# Patient Record
Sex: Female | Born: 1955 | Race: White | Hispanic: No | Marital: Married | Smoking: Former smoker
Health system: Southern US, Community
[De-identification: ages and names within clinical notes are randomized; demographics above are authoritative.]

## PROBLEM LIST (undated history)

## (undated) DIAGNOSIS — F32A Depression, unspecified: Secondary | ICD-10-CM

## (undated) DIAGNOSIS — G8929 Other chronic pain: Secondary | ICD-10-CM

## (undated) DIAGNOSIS — K219 Gastro-esophageal reflux disease without esophagitis: Secondary | ICD-10-CM

## (undated) DIAGNOSIS — F419 Anxiety disorder, unspecified: Secondary | ICD-10-CM

## (undated) DIAGNOSIS — R002 Palpitations: Secondary | ICD-10-CM

## (undated) DIAGNOSIS — T7840XA Allergy, unspecified, initial encounter: Secondary | ICD-10-CM

## (undated) DIAGNOSIS — N6019 Diffuse cystic mastopathy of unspecified breast: Secondary | ICD-10-CM

## (undated) DIAGNOSIS — M1990 Unspecified osteoarthritis, unspecified site: Secondary | ICD-10-CM

## (undated) DIAGNOSIS — I10 Essential (primary) hypertension: Secondary | ICD-10-CM

## (undated) DIAGNOSIS — B351 Tinea unguium: Secondary | ICD-10-CM

## (undated) DIAGNOSIS — E079 Disorder of thyroid, unspecified: Secondary | ICD-10-CM

## (undated) DIAGNOSIS — J302 Other seasonal allergic rhinitis: Secondary | ICD-10-CM

## (undated) DIAGNOSIS — R1310 Dysphagia, unspecified: Secondary | ICD-10-CM

## (undated) DIAGNOSIS — M5030 Other cervical disc degeneration, unspecified cervical region: Secondary | ICD-10-CM

## (undated) DIAGNOSIS — E039 Hypothyroidism, unspecified: Secondary | ICD-10-CM

## (undated) DIAGNOSIS — R011 Cardiac murmur, unspecified: Secondary | ICD-10-CM

## (undated) DIAGNOSIS — F329 Major depressive disorder, single episode, unspecified: Secondary | ICD-10-CM

## (undated) DIAGNOSIS — G709 Myoneural disorder, unspecified: Secondary | ICD-10-CM

## (undated) DIAGNOSIS — C4490 Unspecified malignant neoplasm of skin, unspecified: Secondary | ICD-10-CM

## (undated) DIAGNOSIS — K760 Fatty (change of) liver, not elsewhere classified: Secondary | ICD-10-CM

## (undated) HISTORY — PX: SPINE SURGERY: SHX786

## (undated) HISTORY — DX: Other seasonal allergic rhinitis: J30.2

## (undated) HISTORY — DX: Other cervical disc degeneration, unspecified cervical region: M50.30

## (undated) HISTORY — DX: Gastro-esophageal reflux disease without esophagitis: K21.9

## (undated) HISTORY — DX: Unspecified malignant neoplasm of skin, unspecified: C44.90

## (undated) HISTORY — DX: Cardiac murmur, unspecified: R01.1

## (undated) HISTORY — DX: Essential (primary) hypertension: I10

## (undated) HISTORY — DX: Tinea unguium: B35.1

## (undated) HISTORY — DX: Depression, unspecified: F32.A

## (undated) HISTORY — DX: Diffuse cystic mastopathy of unspecified breast: N60.19

## (undated) HISTORY — DX: Other chronic pain: G89.29

## (undated) HISTORY — PX: TONSILLECTOMY: SUR1361

## (undated) HISTORY — PX: STRABISMUS SURGERY: SHX218

## (undated) HISTORY — DX: Myoneural disorder, unspecified: G70.9

## (undated) HISTORY — PX: MOHS SURGERY: SUR867

## (undated) HISTORY — DX: Allergy, unspecified, initial encounter: T78.40XA

## (undated) HISTORY — DX: Fatty (change of) liver, not elsewhere classified: K76.0

## (undated) HISTORY — DX: Hypothyroidism, unspecified: E03.9

## (undated) HISTORY — DX: Unspecified osteoarthritis, unspecified site: M19.90

## (undated) HISTORY — DX: Major depressive disorder, single episode, unspecified: F32.9

## (undated) HISTORY — DX: Disorder of thyroid, unspecified: E07.9

## (undated) HISTORY — DX: Anxiety disorder, unspecified: F41.9

## (undated) HISTORY — PX: EYE SURGERY: SHX253

## (undated) HISTORY — DX: Palpitations: R00.2

## (undated) HISTORY — DX: Dysphagia, unspecified: R13.10

---

## 1998-05-10 ENCOUNTER — Encounter: Admission: RE | Admit: 1998-05-10 | Discharge: 1998-08-08 | Payer: Self-pay | Admitting: Internal Medicine

## 1998-07-16 HISTORY — PX: SPINE SURGERY: SHX786

## 1998-10-05 ENCOUNTER — Other Ambulatory Visit: Admission: RE | Admit: 1998-10-05 | Discharge: 1998-10-05 | Payer: Self-pay | Admitting: Obstetrics & Gynecology

## 1999-02-14 ENCOUNTER — Ambulatory Visit (HOSPITAL_COMMUNITY): Admission: RE | Admit: 1999-02-14 | Discharge: 1999-02-14 | Payer: Self-pay | Admitting: Neurosurgery

## 1999-02-14 ENCOUNTER — Encounter: Payer: Self-pay | Admitting: Neurosurgery

## 1999-02-14 HISTORY — PX: CERVICAL DISCECTOMY: SHX98

## 1999-02-27 ENCOUNTER — Encounter: Payer: Self-pay | Admitting: Neurosurgery

## 1999-03-01 ENCOUNTER — Encounter: Payer: Self-pay | Admitting: Neurosurgery

## 1999-03-01 ENCOUNTER — Inpatient Hospital Stay (HOSPITAL_COMMUNITY): Admission: RE | Admit: 1999-03-01 | Discharge: 1999-03-02 | Payer: Self-pay | Admitting: Neurosurgery

## 1999-11-01 ENCOUNTER — Other Ambulatory Visit: Admission: RE | Admit: 1999-11-01 | Discharge: 1999-11-01 | Payer: Self-pay | Admitting: Obstetrics & Gynecology

## 2000-02-07 ENCOUNTER — Encounter: Admission: RE | Admit: 2000-02-07 | Discharge: 2000-02-07 | Payer: Self-pay | Admitting: Obstetrics & Gynecology

## 2000-02-07 ENCOUNTER — Encounter: Payer: Self-pay | Admitting: Obstetrics & Gynecology

## 2000-12-04 ENCOUNTER — Other Ambulatory Visit: Admission: RE | Admit: 2000-12-04 | Discharge: 2000-12-04 | Payer: Self-pay | Admitting: Obstetrics & Gynecology

## 2001-03-04 ENCOUNTER — Encounter: Payer: Self-pay | Admitting: Obstetrics & Gynecology

## 2001-03-04 ENCOUNTER — Encounter: Admission: RE | Admit: 2001-03-04 | Discharge: 2001-03-04 | Payer: Self-pay | Admitting: Obstetrics & Gynecology

## 2001-12-23 ENCOUNTER — Other Ambulatory Visit: Admission: RE | Admit: 2001-12-23 | Discharge: 2001-12-23 | Payer: Self-pay | Admitting: Obstetrics & Gynecology

## 2003-02-22 ENCOUNTER — Other Ambulatory Visit: Admission: RE | Admit: 2003-02-22 | Discharge: 2003-02-22 | Payer: Self-pay | Admitting: Obstetrics & Gynecology

## 2003-07-17 HISTORY — PX: ENDOMETRIAL ABLATION: SHX621

## 2003-09-24 ENCOUNTER — Ambulatory Visit (HOSPITAL_COMMUNITY): Admission: RE | Admit: 2003-09-24 | Discharge: 2003-09-24 | Payer: Self-pay | Admitting: Obstetrics & Gynecology

## 2003-09-24 ENCOUNTER — Encounter (INDEPENDENT_AMBULATORY_CARE_PROVIDER_SITE_OTHER): Payer: Self-pay | Admitting: *Deleted

## 2004-04-05 ENCOUNTER — Other Ambulatory Visit: Admission: RE | Admit: 2004-04-05 | Discharge: 2004-04-05 | Payer: Self-pay | Admitting: Obstetrics & Gynecology

## 2004-07-28 ENCOUNTER — Encounter: Admission: RE | Admit: 2004-07-28 | Discharge: 2004-07-28 | Payer: Self-pay | Admitting: Internal Medicine

## 2005-04-26 ENCOUNTER — Other Ambulatory Visit: Admission: RE | Admit: 2005-04-26 | Discharge: 2005-04-26 | Payer: Self-pay | Admitting: Obstetrics & Gynecology

## 2005-05-03 ENCOUNTER — Emergency Department (HOSPITAL_COMMUNITY): Admission: EM | Admit: 2005-05-03 | Discharge: 2005-05-03 | Payer: Self-pay | Admitting: Family Medicine

## 2006-05-20 ENCOUNTER — Encounter: Admission: RE | Admit: 2006-05-20 | Discharge: 2006-05-20 | Payer: Self-pay | Admitting: Internal Medicine

## 2006-05-20 ENCOUNTER — Emergency Department (HOSPITAL_COMMUNITY): Admission: EM | Admit: 2006-05-20 | Discharge: 2006-05-20 | Payer: Self-pay | Admitting: Family Medicine

## 2006-05-22 ENCOUNTER — Emergency Department (HOSPITAL_COMMUNITY): Admission: EM | Admit: 2006-05-22 | Discharge: 2006-05-22 | Payer: Self-pay | Admitting: Family Medicine

## 2007-07-16 ENCOUNTER — Ambulatory Visit (HOSPITAL_COMMUNITY): Admission: RE | Admit: 2007-07-16 | Discharge: 2007-07-16 | Payer: Self-pay | Admitting: Internal Medicine

## 2007-07-25 ENCOUNTER — Encounter: Admission: RE | Admit: 2007-07-25 | Discharge: 2007-07-25 | Payer: Self-pay | Admitting: Internal Medicine

## 2008-03-31 ENCOUNTER — Encounter: Admission: RE | Admit: 2008-03-31 | Discharge: 2008-03-31 | Payer: Self-pay | Admitting: Internal Medicine

## 2008-06-29 ENCOUNTER — Ambulatory Visit: Payer: Self-pay | Admitting: Internal Medicine

## 2008-09-14 ENCOUNTER — Ambulatory Visit: Payer: Self-pay | Admitting: Internal Medicine

## 2008-09-17 ENCOUNTER — Ambulatory Visit: Payer: Self-pay | Admitting: Internal Medicine

## 2008-09-20 ENCOUNTER — Ambulatory Visit: Payer: Self-pay | Admitting: Internal Medicine

## 2008-12-09 ENCOUNTER — Ambulatory Visit: Payer: Self-pay | Admitting: Internal Medicine

## 2008-12-28 ENCOUNTER — Ambulatory Visit: Payer: Self-pay | Admitting: Internal Medicine

## 2009-06-23 ENCOUNTER — Encounter: Admission: RE | Admit: 2009-06-23 | Discharge: 2009-06-23 | Payer: Self-pay | Admitting: Obstetrics & Gynecology

## 2009-07-04 ENCOUNTER — Ambulatory Visit: Payer: Self-pay | Admitting: Internal Medicine

## 2009-08-02 ENCOUNTER — Ambulatory Visit: Payer: Self-pay | Admitting: Internal Medicine

## 2009-09-19 ENCOUNTER — Ambulatory Visit: Payer: Self-pay | Admitting: Internal Medicine

## 2010-03-23 ENCOUNTER — Ambulatory Visit: Payer: Self-pay | Admitting: Internal Medicine

## 2010-04-28 ENCOUNTER — Encounter: Admission: RE | Admit: 2010-04-28 | Discharge: 2010-04-28 | Payer: Self-pay | Admitting: Neurosurgery

## 2010-09-28 ENCOUNTER — Other Ambulatory Visit: Payer: Self-pay | Admitting: Internal Medicine

## 2010-09-29 ENCOUNTER — Other Ambulatory Visit: Payer: Self-pay | Admitting: Internal Medicine

## 2010-09-29 DIAGNOSIS — E039 Hypothyroidism, unspecified: Secondary | ICD-10-CM

## 2010-09-29 DIAGNOSIS — R5381 Other malaise: Secondary | ICD-10-CM

## 2010-09-29 DIAGNOSIS — R5383 Other fatigue: Secondary | ICD-10-CM

## 2010-11-29 ENCOUNTER — Other Ambulatory Visit (INDEPENDENT_AMBULATORY_CARE_PROVIDER_SITE_OTHER): Payer: 59 | Admitting: Internal Medicine

## 2010-11-29 DIAGNOSIS — N390 Urinary tract infection, site not specified: Secondary | ICD-10-CM

## 2010-11-29 LAB — POCT URINALYSIS DIPSTICK
Bilirubin, UA: NEGATIVE
Blood, UA: NEGATIVE
Protein, UA: NEGATIVE

## 2010-11-29 NOTE — Progress Notes (Signed)
Patient here for u/a after finishing antibiotics.

## 2010-12-01 NOTE — Op Note (Signed)
NAME:  Diana House, Diana House                              ACCOUNT NO.:  [id number]   MEDICAL RECORD NO.:  [id number]                   PATIENT TYPE:  AMB   LOCATION:  [facility]C                                  FACILITY:  [facility]   PHYSICIAN:  Freddy Finner, M.D.                DATE OF BIRTH:  02-07-1956   DATE OF PROCEDURE:  09/24/2003  DATE OF DISCHARGE:                                 OPERATIVE REPORT   PREOPERATIVE DIAGNOSES:  Menorrhagia, intramural leiomyomata.   POSTOPERATIVE DIAGNOSIS:  Menorrhagia, intramural leiomyomata.   PROCEDURE:  Hysteroscopy, dilatation & curettage, NovaSure endometrial  ablation.   SURGEON:  Freddy Finner, M.D.   ANESTHESIA:  IV sedation.   INTRAOPERATIVE COMPLICATIONS:  None.   ESTIMATED INTRAOPERATIVE BLOOD LOSS:  10 cc or less.   INTRAOPERATIVE SORBITOL DEFICIT:  60 cc.   INDICATIONS FOR PROCEDURE:  The patient is House 55 year old white married  female with progressively increasing menorrhagia.  Sonohysterogram in the  office revealed essentially no abnormalities of the endometrial cavity,  although there was an intramural leiomyoma.  The patient is now admitted for  NovaSure ablation for menorrhagia and dysmenorrhea.   DESCRIPTION OF PROCEDURE:  The patient was admitted on the morning of  surgery, brought to the operating room, there placed under adequate IV  sedation, placed in the dorsal lithotomy position using the Allen stirrups  system.  Betadine prep of mons, perineum and vagina was carried out in the  usual fashion.  Sterile drapes were applied.   Bivalve speculum was introduced, cervix was visualized, grasped on the  anterior lip with House single-tooth tenaculum.  Paracervical block was placed  using 10 cc of 1% plain Xylocaine.  Cervix sounded to 4 cm.  Uterus and  cervix sounded to 8 cm.  Cervix was progressively dilated with Pratts to 23.  The ACMI 12.5 degrees hysteroscope was introduced using 3% sorbitol as the  distending medium.  Inspection of  the endometrial cavity revealed no  apparent abnormalities.   The NovaSure device was then placed, tested for uterine perforation.  Standard technique was used for placement and seating of the device.  Measurements associated with this are: The sounding length of 8 cm, cervical  length of 4, cavity length of 4, cavity width of 3.7.  Time of procedure was  81 seconds.  After ablating the cavity, reinspection hysteroscopically was carried out.  Photos were made pre and post NovaSure procedure and are retained in the  office record.  At this point, the patient was awakened and was taken to the  recovery room in good condition.                                               Freddy Finner, M.D.  WRN/MEDQ  D:  09/24/2003  T:  09/24/2003  Job:  [id number]

## 2011-01-19 ENCOUNTER — Other Ambulatory Visit: Payer: Self-pay

## 2011-01-19 MED ORDER — HYDROCODONE-ACETAMINOPHEN 10-650 MG PO TABS: 1.0000 | ORAL_TABLET | Freq: Four times a day (QID) | ORAL | Status: AC | PRN

## 2011-03-15 ENCOUNTER — Other Ambulatory Visit: Payer: Self-pay | Admitting: Internal Medicine

## 2011-03-23 ENCOUNTER — Encounter: Payer: Self-pay | Admitting: Internal Medicine

## 2011-03-27 ENCOUNTER — Other Ambulatory Visit: Payer: Self-pay | Admitting: Internal Medicine

## 2011-03-27 ENCOUNTER — Other Ambulatory Visit: Payer: 59 | Admitting: Internal Medicine

## 2011-03-27 DIAGNOSIS — Z0000 Encounter for general adult medical examination without abnormal findings: Secondary | ICD-10-CM

## 2011-03-27 LAB — COMPREHENSIVE METABOLIC PANEL
AST: 52 U/L — ABNORMAL HIGH (ref 0–37)
Albumin: 4.4 g/dL (ref 3.5–5.2)
Glucose, Bld: 98 mg/dL (ref 70–99)
Potassium: 4.2 mEq/L (ref 3.5–5.3)
Sodium: 141 mEq/L (ref 135–145)
Total Protein: 7 g/dL (ref 6.0–8.3)

## 2011-03-27 LAB — LIPID PANEL
Cholesterol: 184 mg/dL (ref 0–200)
HDL: 61 mg/dL (ref >39)
LDL Cholesterol: 102 mg/dL — ABNORMAL HIGH (ref 0–99)
Total CHOL/HDL Ratio: 3 Ratio
Triglycerides: 107 mg/dL (ref <150)
VLDL: 21 mg/dL (ref 0–40)

## 2011-03-28 LAB — CBC WITH DIFFERENTIAL/PLATELET
Basophils Absolute: 0.1 10*3/uL (ref 0.0–0.1)
Basophils Relative: 1 % (ref 0–1)
Eosinophils Absolute: 0.5 10*3/uL (ref 0.0–0.7)
Eosinophils Relative: 8 % — ABNORMAL HIGH (ref 0–5)
HCT: 40 % (ref 36.0–46.0)
Hemoglobin: 12.7 g/dL (ref 12.0–15.0)
Lymphocytes Relative: 31 % (ref 12–46)
MCH: 33.7 pg (ref 26.0–34.0)
Neutro Abs: 3.2 10*3/uL (ref 1.7–7.7)
RBC: 3.77 MIL/uL — ABNORMAL LOW (ref 3.87–5.11)
WBC: 5.8 10*3/uL (ref 4.0–10.5)

## 2011-03-29 ENCOUNTER — Encounter: Payer: Self-pay | Admitting: Internal Medicine

## 2011-03-29 ENCOUNTER — Ambulatory Visit (INDEPENDENT_AMBULATORY_CARE_PROVIDER_SITE_OTHER): Payer: 59 | Admitting: Internal Medicine

## 2011-03-29 VITALS — BP 122/62 | HR 72 | Temp 97.9°F | Ht 66.0 in | Wt 181.0 lb

## 2011-03-29 DIAGNOSIS — F32A Depression, unspecified: Secondary | ICD-10-CM

## 2011-03-29 DIAGNOSIS — E039 Hypothyroidism, unspecified: Secondary | ICD-10-CM

## 2011-03-29 DIAGNOSIS — M5090 Cervical disc disorder, unspecified, unspecified cervical region: Secondary | ICD-10-CM

## 2011-03-29 DIAGNOSIS — I10 Essential (primary) hypertension: Secondary | ICD-10-CM

## 2011-03-29 DIAGNOSIS — F329 Major depressive disorder, single episode, unspecified: Secondary | ICD-10-CM

## 2011-03-29 DIAGNOSIS — K219 Gastro-esophageal reflux disease without esophagitis: Secondary | ICD-10-CM

## 2011-03-29 DIAGNOSIS — Z0000 Encounter for general adult medical examination without abnormal findings: Secondary | ICD-10-CM

## 2011-03-29 DIAGNOSIS — N6019 Diffuse cystic mastopathy of unspecified breast: Secondary | ICD-10-CM

## 2011-03-29 DIAGNOSIS — E559 Vitamin D deficiency, unspecified: Secondary | ICD-10-CM

## 2011-03-29 DIAGNOSIS — F3289 Other specified depressive episodes: Secondary | ICD-10-CM

## 2011-03-29 DIAGNOSIS — Z85820 Personal history of malignant melanoma of skin: Secondary | ICD-10-CM

## 2011-03-29 LAB — POCT URINALYSIS DIPSTICK
Leukocytes, UA: NEGATIVE
Protein, UA: NEGATIVE
Spec Grav, UA: 1
Urobilinogen, UA: NEGATIVE

## 2011-04-11 ENCOUNTER — Encounter: Payer: Self-pay | Admitting: Internal Medicine

## 2011-04-11 DIAGNOSIS — E039 Hypothyroidism, unspecified: Secondary | ICD-10-CM | POA: Insufficient documentation

## 2011-04-11 DIAGNOSIS — M5090 Cervical disc disorder, unspecified, unspecified cervical region: Secondary | ICD-10-CM | POA: Insufficient documentation

## 2011-04-11 DIAGNOSIS — F32A Depression, unspecified: Secondary | ICD-10-CM | POA: Insufficient documentation

## 2011-04-11 DIAGNOSIS — I10 Essential (primary) hypertension: Secondary | ICD-10-CM | POA: Insufficient documentation

## 2011-04-11 DIAGNOSIS — F329 Major depressive disorder, single episode, unspecified: Secondary | ICD-10-CM | POA: Insufficient documentation

## 2011-04-11 DIAGNOSIS — E559 Vitamin D deficiency, unspecified: Secondary | ICD-10-CM | POA: Insufficient documentation

## 2011-04-11 DIAGNOSIS — Z85820 Personal history of malignant melanoma of skin: Secondary | ICD-10-CM | POA: Insufficient documentation

## 2011-04-11 DIAGNOSIS — K219 Gastro-esophageal reflux disease without esophagitis: Secondary | ICD-10-CM | POA: Insufficient documentation

## 2011-04-11 DIAGNOSIS — N6019 Diffuse cystic mastopathy of unspecified breast: Secondary | ICD-10-CM | POA: Insufficient documentation

## 2011-04-11 NOTE — Progress Notes (Signed)
  Subjective:    Patient ID: Diana House, female    DOB: 08-15-1955, 55 y.o.   MRN: [id number]  HPI 55 year old married white female in today for evaluation of medical problems including depression, hypothyroidism, GE reflux, history of vitamin D deficiency, hypertension, fibrocystic breast disease, history of melanoma mid back December 2005, cervical disc disease C5-C6 and C6-C7 requiring surgery August 2000.  Is intolerant of Biaxin. Had a brachial plexus injury 1998. Surgery for strabismus at age 70 and again at age 71, tonsillectomy and HTN, endometrial ablation by Dr. Jennette Kettle 2005   Review of Systems  Constitutional: Positive for fatigue.  HENT: Positive for neck stiffness.   Eyes: Negative.   Respiratory: Negative.   Cardiovascular: Negative.   Gastrointestinal: Negative.   Genitourinary: Negative.   Skin: Negative.   Neurological: Negative.   Hematological: Negative.   Psychiatric/Behavioral: Positive for dysphoric mood. Negative for suicidal ideas and self-injury.       Objective:   Physical Exam  Vitals reviewed. Constitutional: She is oriented to person, place, and time. No distress.  HENT:  Head: Normocephalic and atraumatic.  Right Ear: External ear normal.  Left Ear: External ear normal.  Mouth/Throat: Oropharynx is clear and moist.  Eyes: Pupils are equal, round, and reactive to light. Right eye exhibits no discharge. Left eye exhibits no discharge. No scleral icterus.  Neck: Normal range of motion. Neck supple. No JVD present. No thyromegaly present.  Cardiovascular: Normal rate, regular rhythm and normal heart sounds.   No murmur heard. Pulmonary/Chest: Effort normal and breath sounds normal. She has no wheezes. She has no rales.  Abdominal: Soft. Bowel sounds are normal. She exhibits no distension and no mass. There is no rebound and no guarding.  Genitourinary:       Deferred to GYN  Musculoskeletal: Normal range of motion. She exhibits no edema.  Neurological:  She is alert and oriented to person, place, and time. She has normal reflexes. No cranial nerve deficit. Coordination normal.  Skin: Skin is warm and dry. She is not diaphoretic.       No abnormal nevi  Psychiatric:       Depressed affect          Assessment & Plan:   Depression  Hypothyroidism on thyroid replacement therapy  GE reflux  History of vitamin D deficiency  History of melanoma mid back  History of hypertension  Fibrocystic breast disease  History of cervical disc disease requires Lorcet for pain control  Review lab work with her. Continue same medications. Confides that daughter has recently told her she may be gay and this is devastating to patient. Recommend psychologist for patient to discuss this with. Return in 6 months at which time she'll be due for a TSH office visit and evaluation.

## 2011-04-12 ENCOUNTER — Other Ambulatory Visit: Payer: Self-pay

## 2011-04-12 MED ORDER — HYDROCODONE-ACETAMINOPHEN 10-650 MG PO TABS: 1.0000 | ORAL_TABLET | Freq: Four times a day (QID) | ORAL | Status: AC | PRN

## 2011-04-12 MED ORDER — HYDROCODONE-ACETAMINOPHEN 10-650 MG PO TABS: 1.0000 | ORAL_TABLET | Freq: Four times a day (QID) | ORAL | Status: DC | PRN

## 2011-04-20 LAB — COMPREHENSIVE METABOLIC PANEL
ALT: 21
Calcium: 9.2
Creatinine, Ser: 0.61
GFR calc Af Amer: >60
GFR calc non Af Amer: >60
Potassium: 4.6
Sodium: 139

## 2011-04-20 LAB — LIPID PANEL
Cholesterol: 178
Total CHOL/HDL Ratio: 2.7
Triglycerides: 96

## 2011-04-20 LAB — DIFFERENTIAL
Eosinophils Relative: 8 — ABNORMAL HIGH
Monocytes Relative: 6
Neutro Abs: 3.5
Neutrophils Relative %: 53

## 2011-04-20 LAB — CBC
MCHC: 34.6
RBC: 3.8 — ABNORMAL LOW
RDW: 12.5

## 2011-06-15 ENCOUNTER — Ambulatory Visit (INDEPENDENT_AMBULATORY_CARE_PROVIDER_SITE_OTHER): Payer: 59 | Admitting: Internal Medicine

## 2011-06-15 ENCOUNTER — Encounter: Payer: Self-pay | Admitting: Internal Medicine

## 2011-06-15 VITALS — BP 136/90 | HR 80 | Temp 98.6°F | Wt 179.0 lb

## 2011-06-15 DIAGNOSIS — R300 Dysuria: Secondary | ICD-10-CM

## 2011-06-15 DIAGNOSIS — N390 Urinary tract infection, site not specified: Secondary | ICD-10-CM

## 2011-06-15 LAB — POCT URINALYSIS DIPSTICK
Bilirubin, UA: NEGATIVE
Blood, UA: NEGATIVE
Glucose, UA: NEGATIVE
Spec Grav, UA: 1.01
pH, UA: 7.5

## 2011-06-15 NOTE — Patient Instructions (Signed)
Take doxycycline twice daily for 10 days. Call if symptoms recur or sooner if worse.

## 2011-06-15 NOTE — Progress Notes (Signed)
  Subjective:    Patient ID: Diana House, female    DOB: 19-Feb-1956, 55 y.o.   MRN: [id number]  HPI 55 year old white married female with complaint of dysuria. . No fever, chills, nausea or back pain.    Review of Systems     Objective:   Physical Exam no CVA tenderness; urinalysis is abnormal. See report.        Assessment & Plan:  UTI  Plan: Doxycycline 100 mg by mouth twice daily for 10 days

## 2011-06-26 ENCOUNTER — Encounter: Payer: Self-pay | Admitting: Internal Medicine

## 2011-06-26 ENCOUNTER — Ambulatory Visit (INDEPENDENT_AMBULATORY_CARE_PROVIDER_SITE_OTHER): Payer: 59 | Admitting: Internal Medicine

## 2011-06-26 ENCOUNTER — Other Ambulatory Visit: Payer: Self-pay | Admitting: Internal Medicine

## 2011-06-26 VITALS — BP 146/88 | HR 80 | Temp 98.5°F | Wt 179.0 lb

## 2011-06-26 DIAGNOSIS — R6889 Other general symptoms and signs: Secondary | ICD-10-CM

## 2011-06-26 DIAGNOSIS — R300 Dysuria: Secondary | ICD-10-CM

## 2011-06-26 DIAGNOSIS — R7989 Other specified abnormal findings of blood chemistry: Secondary | ICD-10-CM

## 2011-06-26 DIAGNOSIS — E039 Hypothyroidism, unspecified: Secondary | ICD-10-CM

## 2011-06-26 DIAGNOSIS — R945 Abnormal results of liver function studies: Secondary | ICD-10-CM

## 2011-06-26 DIAGNOSIS — D7589 Other specified diseases of blood and blood-forming organs: Secondary | ICD-10-CM

## 2011-06-26 LAB — CBC WITH DIFFERENTIAL/PLATELET
Eosinophils Absolute: 0.3 10*3/uL (ref 0.0–0.7)
Lymphocytes Relative: 35 % (ref 12–46)
MCH: 33.9 pg (ref 26.0–34.0)
MCV: 102.1 fL — ABNORMAL HIGH (ref 78.0–100.0)
Monocytes Absolute: 0.6 10*3/uL (ref 0.1–1.0)
Monocytes Relative: 7 % (ref 3–12)
Neutro Abs: 4.3 10*3/uL (ref 1.7–7.7)
WBC: 8 10*3/uL (ref 4.0–10.5)

## 2011-06-26 LAB — POCT URINALYSIS DIPSTICK
Bilirubin, UA: NEGATIVE
Blood, UA: NEGATIVE
Ketones, UA: NEGATIVE
Spec Grav, UA: 1.015

## 2011-06-26 LAB — VITAMIN B12: Vitamin B-12: 761 pg/mL (ref 211–911)

## 2011-06-26 LAB — HEPATIC FUNCTION PANEL: AST: 31 U/L (ref 0–37)

## 2011-06-26 NOTE — Progress Notes (Signed)
  Subjective:    Patient ID: Diana House, female    DOB: 1956/07/11, 55 y.o.   MRN: [id number]  HPI Patient returns today complaining of dysuria at end of stream that never went away after being on doxycycline. Urine recollected today. Urinalysis is normal. Culture was sent to lab today. Also, following up on macrocytosis which was 106.1 in September with normal B12 and folate levels. Also following up on mild liver function elevations in September. She does take Lorcet 10/650 on a  regular basis for neck pain. This could possibly cause mild elevation in liver functions.    Review of Systems     Objective:   Physical Exam no CVA tenderness        Assessment & Plan:  Dysuria/urethritis  Elevated liver functions-recheck liver panel  Macrocytosis-recheck CBC and B12 level  Hypothyroidism  Plan: Patient due for repeat TSH March 2013. TSH checked September 2012 was entirely within normal limits on Synthroid. At her request refill Wellbutrin and Lexapro #90 each with when necessary one year refills to mail to [facility].  For dysuria Cipro 500 mg twice daily for 7 days. If symptoms do not resolve, refer to urologist

## 2011-06-26 NOTE — Patient Instructions (Signed)
Take Cipro twice daily for 7 days. If symptoms persist, we will refer you to urologist. Continue Synthroid as previously prescribed. We have checked liver functions, CBC and B12 level today. We have refilled Wellbutrin and Lexapro for one year.

## 2011-06-28 ENCOUNTER — Ambulatory Visit: Payer: 59 | Admitting: Internal Medicine

## 2011-06-28 LAB — URINE CULTURE

## 2011-07-02 ENCOUNTER — Other Ambulatory Visit: Payer: Self-pay | Admitting: Internal Medicine

## 2011-07-06 ENCOUNTER — Other Ambulatory Visit: Payer: Self-pay

## 2011-07-06 MED ORDER — HYDROCODONE-APAP-DIETARY PROD 10-650 MG PO MISC: 1.0000 | Freq: Four times a day (QID) | ORAL | Status: DC | PRN

## 2011-08-03 ENCOUNTER — Other Ambulatory Visit: Payer: Self-pay | Admitting: Internal Medicine

## 2011-08-06 ENCOUNTER — Other Ambulatory Visit: Payer: Self-pay

## 2011-08-06 MED ORDER — HYDROCODONE-APAP-DIETARY PROD 10-650 MG PO MISC: 1.0000 | Freq: Four times a day (QID) | ORAL | Status: DC | PRN

## 2011-10-01 ENCOUNTER — Other Ambulatory Visit: Payer: Self-pay | Admitting: Internal Medicine

## 2011-11-08 ENCOUNTER — Other Ambulatory Visit: Payer: 59 | Admitting: Internal Medicine

## 2011-11-08 DIAGNOSIS — E039 Hypothyroidism, unspecified: Secondary | ICD-10-CM

## 2011-11-08 LAB — TSH: TSH: 0.873 u[IU]/mL (ref 0.350–4.500)

## 2011-11-09 ENCOUNTER — Ambulatory Visit (INDEPENDENT_AMBULATORY_CARE_PROVIDER_SITE_OTHER): Payer: 59 | Admitting: Internal Medicine

## 2011-11-09 VITALS — BP 130/80 | Wt 174.0 lb

## 2011-11-09 DIAGNOSIS — F32A Depression, unspecified: Secondary | ICD-10-CM

## 2011-11-09 DIAGNOSIS — Z85820 Personal history of malignant melanoma of skin: Secondary | ICD-10-CM

## 2011-11-09 DIAGNOSIS — F329 Major depressive disorder, single episode, unspecified: Secondary | ICD-10-CM

## 2011-11-09 DIAGNOSIS — E039 Hypothyroidism, unspecified: Secondary | ICD-10-CM

## 2011-11-09 DIAGNOSIS — I10 Essential (primary) hypertension: Secondary | ICD-10-CM

## 2011-11-09 DIAGNOSIS — M5090 Cervical disc disorder, unspecified, unspecified cervical region: Secondary | ICD-10-CM

## 2011-11-14 ENCOUNTER — Encounter: Payer: Self-pay | Admitting: Internal Medicine

## 2011-11-14 NOTE — Progress Notes (Signed)
  Subjective:    Patient ID: Tyson Dense, female    DOB: Jan 21, 1956, 56 y.o.   MRN: [id number]  HPI 56 year old white female with history of hypertension, cervical disc disease, depression, GE reflux, history of melanoma, hypothyroidism and vitamin D deficiency for six-month recheck. Takes Wellbutrin, HCTZ, Lexapro, hydrocodone/APAP 10/650 for neck pain, Prilosec and Synthroid 0.15 mg daily. TSH drawn today. Doing well without significant complaints. 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 No increase in neck pain with new job   Review of Systems     Objective:   Physical Exam neck is supple without thyromegaly; chest clear to auscultation, cardiac exam: regular rate and rhythm, extremities without edema        Assessment & Plan:  Hypothyroidism  Depression  Cervical disc disease  Hypertension  GE reflux  Plan: Okay to refill Lorcet 10/650 if pharmacy calls. May have #60 every 30 days only. Return in 6 months for physical exam.  TSH is normal. Continue same dose of Synthroid.

## 2011-11-14 NOTE — Patient Instructions (Signed)
Continue same medications. Continue same dose of Synthroid. Return in 6 months for physical exam. Take Lorcet 10/650 sparingly no more than twice daily for neck pain.

## 2011-12-29 ENCOUNTER — Other Ambulatory Visit: Payer: Self-pay | Admitting: Internal Medicine

## 2012-01-21 ENCOUNTER — Other Ambulatory Visit: Payer: Self-pay

## 2012-01-22 ENCOUNTER — Other Ambulatory Visit: Payer: Self-pay

## 2012-01-22 MED ORDER — HYDROCODONE-APAP-DIETARY PROD 10-650 MG PO MISC: 1.0000 | Freq: Four times a day (QID) | ORAL | Status: DC | PRN

## 2012-03-31 ENCOUNTER — Other Ambulatory Visit: Payer: Self-pay | Admitting: Internal Medicine

## 2012-03-31 ENCOUNTER — Other Ambulatory Visit: Payer: 59 | Admitting: Internal Medicine

## 2012-03-31 DIAGNOSIS — I10 Essential (primary) hypertension: Secondary | ICD-10-CM

## 2012-03-31 DIAGNOSIS — E039 Hypothyroidism, unspecified: Secondary | ICD-10-CM

## 2012-03-31 DIAGNOSIS — E559 Vitamin D deficiency, unspecified: Secondary | ICD-10-CM

## 2012-04-01 ENCOUNTER — Encounter: Payer: Self-pay | Admitting: Internal Medicine

## 2012-04-01 ENCOUNTER — Ambulatory Visit (INDEPENDENT_AMBULATORY_CARE_PROVIDER_SITE_OTHER): Payer: 59 | Admitting: Internal Medicine

## 2012-04-01 VITALS — BP 142/86 | HR 76 | Ht 65.75 in | Wt 174.0 lb

## 2012-04-01 DIAGNOSIS — F329 Major depressive disorder, single episode, unspecified: Secondary | ICD-10-CM

## 2012-04-01 DIAGNOSIS — E039 Hypothyroidism, unspecified: Secondary | ICD-10-CM

## 2012-04-01 DIAGNOSIS — M542 Cervicalgia: Secondary | ICD-10-CM

## 2012-04-01 DIAGNOSIS — Z8639 Personal history of other endocrine, nutritional and metabolic disease: Secondary | ICD-10-CM

## 2012-04-01 DIAGNOSIS — Z85820 Personal history of malignant melanoma of skin: Secondary | ICD-10-CM

## 2012-04-01 DIAGNOSIS — N6019 Diffuse cystic mastopathy of unspecified breast: Secondary | ICD-10-CM

## 2012-04-01 DIAGNOSIS — G8929 Other chronic pain: Secondary | ICD-10-CM

## 2012-04-01 DIAGNOSIS — I10 Essential (primary) hypertension: Secondary | ICD-10-CM

## 2012-04-01 DIAGNOSIS — K219 Gastro-esophageal reflux disease without esophagitis: Secondary | ICD-10-CM

## 2012-04-01 DIAGNOSIS — M5090 Cervical disc disorder, unspecified, unspecified cervical region: Secondary | ICD-10-CM

## 2012-04-01 DIAGNOSIS — F32A Depression, unspecified: Secondary | ICD-10-CM

## 2012-04-01 LAB — CBC WITH DIFFERENTIAL/PLATELET
Basophils Absolute: 0.1 10*3/uL (ref 0.0–0.1)
Basophils Relative: 1 % (ref 0–1)
Eosinophils Absolute: 0.4 10*3/uL (ref 0.0–0.7)
Hemoglobin: 12.6 g/dL (ref 12.0–15.0)
MCH: 33.6 pg (ref 26.0–34.0)
MCHC: 32 g/dL (ref 30.0–36.0)
Monocytes Relative: 6 % (ref 3–12)
Neutro Abs: 4.6 10*3/uL (ref 1.7–7.7)
Neutrophils Relative %: 58 % (ref 43–77)
Platelets: 270 10*3/uL (ref 150–400)
RBC: 3.75 MIL/uL — ABNORMAL LOW (ref 3.87–5.11)

## 2012-04-01 LAB — COMPREHENSIVE METABOLIC PANEL
ALT: 48 U/L — ABNORMAL HIGH (ref 0–35)
AST: 33 U/L (ref 0–37)
Albumin: 4.6 g/dL (ref 3.5–5.2)
Alkaline Phosphatase: 89 U/L (ref 39–117)
Glucose, Bld: 92 mg/dL (ref 70–99)
Potassium: 4.1 mEq/L (ref 3.5–5.3)
Sodium: 139 mEq/L (ref 135–145)
Total Bilirubin: 0.5 mg/dL (ref 0.3–1.2)
Total Protein: 6.8 g/dL (ref 6.0–8.3)

## 2012-04-01 LAB — TSH: TSH: 1.466 u[IU]/mL (ref 0.350–4.500)

## 2012-04-01 LAB — VITAMIN D 25 HYDROXY (VIT D DEFICIENCY, FRACTURES): Vit D, 25-Hydroxy: 38 ng/mL (ref 30–89)

## 2012-04-01 LAB — POCT URINALYSIS DIPSTICK
Ketones, UA: NEGATIVE
Leukocytes, UA: NEGATIVE
Protein, UA: NEGATIVE
Urobilinogen, UA: NEGATIVE
pH, UA: 6

## 2012-04-01 LAB — LIPID PANEL
LDL Cholesterol: 109 mg/dL — ABNORMAL HIGH (ref 0–99)
Triglycerides: 119 mg/dL (ref <150)
VLDL: 24 mg/dL (ref 0–40)

## 2012-04-01 LAB — FOLATE: Folate: 4.4 ng/mL

## 2012-04-10 ENCOUNTER — Other Ambulatory Visit: Payer: Self-pay | Admitting: Internal Medicine

## 2012-04-10 NOTE — Telephone Encounter (Signed)
Give # 60 Lorcet with 2 refills please

## 2012-04-11 ENCOUNTER — Other Ambulatory Visit: Payer: Self-pay

## 2012-06-22 ENCOUNTER — Encounter: Payer: Self-pay | Admitting: Internal Medicine

## 2012-06-22 NOTE — Progress Notes (Signed)
  Subjective:    Patient ID: Diana House, female    DOB: [birth year], 56 y.o.   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 Recent lab work showed macrocytosis. B12 and folate levels will be added to lab work. GYN has her on Vivelle dot for estrogen replacement and progesterone.  Had brachial plexus injury 1998, melanoma removed from mid back December 2005 with no recurrence. 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 Jennette Kettle in 2005.  Tetanus immunization obtained through employment as his flu vaccine.  Intolerant of Biaxin otherwise no known drug allergies.  She is married and has 2 sons and a daughter.  Occasional wine consumption. Does not smoke.  Family history: Father with history coronary artery disease, diabetes, CABG and hypertension 3 brothers and one sister in good health. Mother in good health.  History of lump left breast in 1995 which resolved spontaneously. [facility] and [facility] Dr. Orpah Greek for followup.    Review of Systems  Constitutional: Positive for fatigue.  HENT: Negative.   Eyes: Negative.   Respiratory: Negative.   Cardiovascular: Negative.   Gastrointestinal: Negative.   Genitourinary: Negative.   Musculoskeletal: Positive for arthralgias.       Neck pain  Neurological: Negative.   Hematological: Negative.   Psychiatric/Behavioral: Positive for dysphoric mood.       Objective:   Physical Exam  Vitals reviewed. Constitutional: She is oriented to person, place, and time. She appears well-developed and well-nourished. No distress.  HENT:   Head: Normocephalic and atraumatic.  Right Ear: External ear normal.  Left Ear: External ear normal.  Mouth/Throat: Oropharynx is clear and moist.  Eyes: Conjunctivae normal are normal. Pupils are equal, round, and reactive to light. Right eye exhibits no discharge. Left eye exhibits no discharge.  Neck: Neck supple. No JVD present. No thyromegaly present.  Cardiovascular: Normal rate, regular rhythm, normal heart sounds and intact distal pulses.   No murmur heard. Pulmonary/Chest: Effort normal and breath sounds normal. No respiratory distress. She has no wheezes. She has no rales.       Breasts normal female without masses  Abdominal: Soft. Bowel sounds are normal. She exhibits no distension and no mass. There is no tenderness. There is no rebound and no guarding.  Genitourinary:       Deferred to Dr. Jennette Kettle  Musculoskeletal: She exhibits no edema.  Lymphadenopathy:    She has no cervical adenopathy.  Neurological: She is oriented to person, place, and time. She has normal reflexes. She displays normal reflexes. No cranial nerve deficit. Coordination abnormal.  Skin: Skin is warm and dry. No rash noted. She is not diaphoretic.  Psychiatric: She has a normal mood and affect. Her behavior is normal. Judgment normal.          Assessment & Plan:  Hypothyroidism  History of melanoma  History of depression  GE reflux  Hypertension  Vitamin D deficiency  Fibrocystic breast disease  Plan: Return in 6 months for followup TSH and evaluation of medical issues. Continue to take hydrocodone/APAP sparingly for neck and back pain.

## 2012-06-22 NOTE — Patient Instructions (Addendum)
Continue same medications and return in 6 months 

## 2012-06-23 ENCOUNTER — Other Ambulatory Visit: Payer: Self-pay | Admitting: Internal Medicine

## 2012-06-24 ENCOUNTER — Other Ambulatory Visit: Payer: Self-pay

## 2012-06-24 MED ORDER — HYDROCODONE-APAP-DIETARY PROD 10-325 MG PO MISC: 10.0000 mg | Freq: Four times a day (QID) | ORAL | Status: DC | PRN

## 2012-06-24 NOTE — Telephone Encounter (Signed)
Please change to Hydrocodone 10/325 and refill for 6 months . Sixty should last q 30 days.

## 2012-08-30 ENCOUNTER — Other Ambulatory Visit: Payer: Self-pay

## 2012-09-29 ENCOUNTER — Other Ambulatory Visit: Payer: 59 | Admitting: Internal Medicine

## 2012-09-29 DIAGNOSIS — E785 Hyperlipidemia, unspecified: Secondary | ICD-10-CM

## 2012-09-29 DIAGNOSIS — E039 Hypothyroidism, unspecified: Secondary | ICD-10-CM

## 2012-09-29 DIAGNOSIS — D649 Anemia, unspecified: Secondary | ICD-10-CM

## 2012-09-29 LAB — LIPID PANEL
LDL Cholesterol: 84 mg/dL (ref 0–99)
VLDL: 30 mg/dL (ref 0–40)

## 2012-09-29 LAB — CBC WITH DIFFERENTIAL/PLATELET
Basophils Absolute: 0.1 10*3/uL (ref 0.0–0.1)
Basophils Relative: 1 % (ref 0–1)
Eosinophils Absolute: 0.4 10*3/uL (ref 0.0–0.7)
Eosinophils Relative: 6 % — ABNORMAL HIGH (ref 0–5)
HCT: 37.2 % (ref 36.0–46.0)
MCHC: 34.4 g/dL (ref 30.0–36.0)
MCV: 96.4 fL (ref 78.0–100.0)
Monocytes Absolute: 0.4 10*3/uL (ref 0.1–1.0)
RDW: 12.9 % (ref 11.5–15.5)

## 2012-09-30 ENCOUNTER — Encounter: Payer: Self-pay | Admitting: Internal Medicine

## 2012-09-30 ENCOUNTER — Ambulatory Visit (INDEPENDENT_AMBULATORY_CARE_PROVIDER_SITE_OTHER): Payer: 59 | Admitting: Internal Medicine

## 2012-09-30 VITALS — BP 130/80 | Temp 97.7°F | Wt 180.0 lb

## 2012-09-30 DIAGNOSIS — F329 Major depressive disorder, single episode, unspecified: Secondary | ICD-10-CM

## 2012-09-30 DIAGNOSIS — K219 Gastro-esophageal reflux disease without esophagitis: Secondary | ICD-10-CM

## 2012-09-30 DIAGNOSIS — F32A Depression, unspecified: Secondary | ICD-10-CM

## 2012-09-30 DIAGNOSIS — I10 Essential (primary) hypertension: Secondary | ICD-10-CM

## 2012-09-30 DIAGNOSIS — M545 Low back pain, unspecified: Secondary | ICD-10-CM

## 2012-09-30 DIAGNOSIS — E039 Hypothyroidism, unspecified: Secondary | ICD-10-CM

## 2012-09-30 DIAGNOSIS — Z8639 Personal history of other endocrine, nutritional and metabolic disease: Secondary | ICD-10-CM

## 2012-09-30 DIAGNOSIS — M5090 Cervical disc disorder, unspecified, unspecified cervical region: Secondary | ICD-10-CM

## 2012-09-30 DIAGNOSIS — Z85820 Personal history of malignant melanoma of skin: Secondary | ICD-10-CM

## 2012-09-30 MED ORDER — LEVOTHYROXINE SODIUM 150 MCG PO TABS: 150.0000 ug | ORAL_TABLET | Freq: Every day | ORAL | Status: DC

## 2012-09-30 MED ORDER — ESCITALOPRAM OXALATE 10 MG PO TABS: 10.0000 mg | ORAL_TABLET | Freq: Every day | ORAL | Status: DC

## 2012-09-30 MED ORDER — HYDROCHLOROTHIAZIDE 25 MG PO TABS: 25.0000 mg | ORAL_TABLET | Freq: Every day | ORAL | Status: DC

## 2012-09-30 MED ORDER — BUPROPION HCL ER (XL) 300 MG PO TB24: 300.0000 mg | ORAL_TABLET | Freq: Every day | ORAL | Status: DC

## 2012-09-30 NOTE — Progress Notes (Signed)
  Subjective:    Patient ID: Diana House, female    DOB: [birth year], 57 y.o.   MRN: [id number]  HPI In today for six-month recheck on hypertension and hypothyroidism. She has returned to [facility] ER and feels better mentally than she has in  a long time. She is working [employer] there and  is quite happy. History of depression, vitamin D deficiency, fibrocystic breast disease, GE reflux, cervical disc disease, low back pain, history of melanoma. Has been seen by dermatologist recently for skin check. Has had mammogram and GYN exam recently as well. Influenza immunization done through employment. Continues on Wellbutrin and generic Lexapro. Taking HCTZ for hypertension. 4 chronic musculoskeletal pain neck and back she is taking hydrocodone/APAP 10/325 twice daily. Sometimes needs an extra half tablet for  pain control. Offered to send her to physical therapy for back pain. She will consider it. GYN has her taking Krill oil. GE reflux controlled with omeprazole.    Review of Systems     Objective:   Physical Exam  Vitals reviewed. Neck: No thyromegaly present.  Cardiovascular: Normal rate, regular rhythm and normal heart sounds.   Pulmonary/Chest: Effort normal and breath sounds normal. No respiratory distress. She has no wheezes. She has no rales.  Lymphadenopathy:    She has no cervical adenopathy.  Skin: Skin is warm and dry.  Psychiatric: She has a normal mood and affect. Her behavior is normal. Judgment and thought content normal.          Assessment & Plan:  Hypertension-stable on diuretic refill to mail order pharmacy for one year  Hypothyroidism-TSH within normal limits continue Synthroid 0.15 mg daily and recheck in 6 months. Refill to mail order pharmacy for six-month  Cervical disc disease and low back pain-treated with Norco 10/325 twice daily. Consider physical therapy.  Depression-stable on Lexapro and Wellbutrin. 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 Refill to mail order pharmacy for one year  History of melanoma-had recent skin exam by dermatologist  Hormone replacement on progesterone for Dr. Jennette Kettle  History of vitamin D deficiency-have recommended 2000 units vitamin D 3 daily. Followup in 6 months time of physical exam.  CBC was repeated recently because of elevated MCV on last CBC in September and previous CBC. MCV is now normal. Hemoglobin and white blood cell count as well as platelet count normal.  Time spent 25 minutes

## 2012-09-30 NOTE — Patient Instructions (Addendum)
Continue same medications and return in 6 months for physical exam. Lab work reviewed with you today and is within normal limits.

## 2012-10-03 MED ORDER — HYDROCHLOROTHIAZIDE 25 MG PO TABS: 25.0000 mg | ORAL_TABLET | Freq: Every day | ORAL | Status: DC

## 2012-10-03 MED ORDER — BUPROPION HCL ER (XL) 300 MG PO TB24: 300.0000 mg | ORAL_TABLET | Freq: Every day | ORAL | Status: DC

## 2012-10-03 MED ORDER — LEVOTHYROXINE SODIUM 150 MCG PO TABS: 150.0000 ug | ORAL_TABLET | Freq: Every day | ORAL | Status: DC

## 2012-10-03 MED ORDER — ESCITALOPRAM OXALATE 10 MG PO TABS: 10.0000 mg | ORAL_TABLET | Freq: Every day | ORAL | Status: DC

## 2012-10-03 NOTE — Addendum Note (Signed)
Addended by: Judy Pimple on: 10/03/2012 04:01 PM   Modules accepted: Orders

## 2012-10-07 ENCOUNTER — Other Ambulatory Visit: Payer: Self-pay | Admitting: Internal Medicine

## 2012-10-13 ENCOUNTER — Other Ambulatory Visit: Payer: Self-pay

## 2012-10-20 ENCOUNTER — Other Ambulatory Visit: Payer: Self-pay

## 2012-10-20 MED ORDER — ESCITALOPRAM OXALATE 10 MG PO TABS: 10.0000 mg | ORAL_TABLET | Freq: Every day | ORAL | Status: DC

## 2012-11-14 ENCOUNTER — Other Ambulatory Visit: Payer: Self-pay | Admitting: Internal Medicine

## 2012-11-14 ENCOUNTER — Other Ambulatory Visit: Payer: Self-pay

## 2012-11-14 NOTE — Telephone Encounter (Signed)
Refill for 3 months. 

## 2012-12-06 ENCOUNTER — Other Ambulatory Visit: Payer: Self-pay | Admitting: Internal Medicine

## 2013-04-02 ENCOUNTER — Other Ambulatory Visit: Payer: Self-pay | Admitting: Internal Medicine

## 2013-04-02 NOTE — Telephone Encounter (Signed)
Patient has requested an early refill of Hydrocodone 10-325 because she is going on vacation. Just got a refill on 03/17/2013 of 60 tablets. Authorized a refill of 30 tablets only until this can be discussed with Dr. Lenord Fellers.

## 2013-04-07 ENCOUNTER — Other Ambulatory Visit: Payer: 59 | Admitting: Internal Medicine

## 2013-04-09 ENCOUNTER — Encounter: Payer: 59 | Admitting: Internal Medicine

## 2013-04-14 ENCOUNTER — Other Ambulatory Visit: Payer: 59 | Admitting: Internal Medicine

## 2013-04-14 DIAGNOSIS — E039 Hypothyroidism, unspecified: Secondary | ICD-10-CM

## 2013-04-14 DIAGNOSIS — E559 Vitamin D deficiency, unspecified: Secondary | ICD-10-CM

## 2013-04-14 DIAGNOSIS — Z13220 Encounter for screening for lipoid disorders: Secondary | ICD-10-CM

## 2013-04-14 DIAGNOSIS — Z130 Encounter for screening for diseases of the blood and blood-forming organs and certain disorders involving the immune mechanism: Secondary | ICD-10-CM

## 2013-04-14 DIAGNOSIS — I10 Essential (primary) hypertension: Secondary | ICD-10-CM

## 2013-04-14 LAB — CBC WITH DIFFERENTIAL/PLATELET
Eosinophils Relative: 6 % — ABNORMAL HIGH (ref 0–5)
HCT: 36.5 % (ref 36.0–46.0)
Lymphocytes Relative: 34 % (ref 12–46)
Lymphs Abs: 2 10*3/uL (ref 0.7–4.0)
MCV: 99.5 fL (ref 78.0–100.0)
Monocytes Absolute: 0.5 10*3/uL (ref 0.1–1.0)
RBC: 3.67 MIL/uL — ABNORMAL LOW (ref 3.87–5.11)
WBC: 5.7 10*3/uL (ref 4.0–10.5)

## 2013-04-14 LAB — LIPID PANEL
Cholesterol: 169 mg/dL (ref 0–200)
HDL: 60 mg/dL (ref >39)
Total CHOL/HDL Ratio: 2.8 Ratio
Triglycerides: 86 mg/dL (ref <150)
VLDL: 17 mg/dL (ref 0–40)

## 2013-04-14 LAB — COMPREHENSIVE METABOLIC PANEL
BUN: 14 mg/dL (ref 6–23)
CO2: 31 mEq/L (ref 19–32)
Calcium: 9.1 mg/dL (ref 8.4–10.5)
Chloride: 101 mEq/L (ref 96–112)
Creat: 0.68 mg/dL (ref 0.50–1.10)

## 2013-04-14 LAB — TSH: TSH: 0.407 u[IU]/mL (ref 0.350–4.500)

## 2013-04-15 LAB — VITAMIN D 25 HYDROXY (VIT D DEFICIENCY, FRACTURES): Vit D, 25-Hydroxy: 50 ng/mL (ref 30–89)

## 2013-04-16 ENCOUNTER — Encounter: Payer: 59 | Admitting: Internal Medicine

## 2013-04-23 ENCOUNTER — Telehealth: Payer: Self-pay

## 2013-04-23 ENCOUNTER — Encounter: Payer: Self-pay | Admitting: Internal Medicine

## 2013-04-23 ENCOUNTER — Ambulatory Visit (INDEPENDENT_AMBULATORY_CARE_PROVIDER_SITE_OTHER): Payer: 59 | Admitting: Internal Medicine

## 2013-04-23 VITALS — BP 136/80 | HR 68 | Ht 66.5 in | Wt 181.0 lb

## 2013-04-23 DIAGNOSIS — I10 Essential (primary) hypertension: Secondary | ICD-10-CM

## 2013-04-23 DIAGNOSIS — R748 Abnormal levels of other serum enzymes: Secondary | ICD-10-CM

## 2013-04-23 DIAGNOSIS — IMO0001 Reserved for inherently not codable concepts without codable children: Secondary | ICD-10-CM

## 2013-04-23 DIAGNOSIS — Z87898 Personal history of other specified conditions: Secondary | ICD-10-CM

## 2013-04-23 DIAGNOSIS — M5090 Cervical disc disorder, unspecified, unspecified cervical region: Secondary | ICD-10-CM

## 2013-04-23 DIAGNOSIS — R6889 Other general symptoms and signs: Secondary | ICD-10-CM

## 2013-04-23 DIAGNOSIS — Z8639 Personal history of other endocrine, nutritional and metabolic disease: Secondary | ICD-10-CM

## 2013-04-23 DIAGNOSIS — M7918 Myalgia, other site: Secondary | ICD-10-CM

## 2013-04-23 DIAGNOSIS — E039 Hypothyroidism, unspecified: Secondary | ICD-10-CM

## 2013-04-23 DIAGNOSIS — Z8659 Personal history of other mental and behavioral disorders: Secondary | ICD-10-CM

## 2013-04-23 DIAGNOSIS — Z85820 Personal history of malignant melanoma of skin: Secondary | ICD-10-CM

## 2013-04-23 DIAGNOSIS — Z9889 Other specified postprocedural states: Secondary | ICD-10-CM

## 2013-04-23 DIAGNOSIS — Z0000 Encounter for general adult medical examination without abnormal findings: Secondary | ICD-10-CM

## 2013-04-23 LAB — POCT URINALYSIS DIPSTICK
Bilirubin, UA: NEGATIVE
Blood, UA: NEGATIVE
Color, UA: NEGATIVE
Glucose, UA: NEGATIVE
Ketones, UA: NEGATIVE
Leukocytes, UA: NEGATIVE
Nitrite, UA: NEGATIVE
Spec Grav, UA: 1.01
Urobilinogen, UA: NEGATIVE
pH, UA: 7

## 2013-04-23 MED ORDER — HYDROCODONE-ACETAMINOPHEN 10-325 MG PO TABS: 1.0000 | ORAL_TABLET | Freq: Three times a day (TID) | ORAL | Status: DC | PRN

## 2013-04-23 MED ORDER — TRAMADOL HCL 50 MG PO TABS: 50.0000 mg | ORAL_TABLET | Freq: Three times a day (TID) | ORAL | Status: DC | PRN

## 2013-04-23 NOTE — Telephone Encounter (Signed)
Notified patient of abd u/s on Wednesday, October 15 @ 0800.  Pt stated understanding of NPO status after midnight the night before.

## 2013-04-23 NOTE — Progress Notes (Signed)
Subjective:    Patient ID: Diana House, female    DOB: [birth year], 56 y.o.   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 She has a history of hypertension, cervical disc disease, depression, GE reflux, fibrocystic breast disease, history of melanoma, hypothyroidism, history of vitamin D deficiency. She says mentally she said that she's been in 10 years. She is very pleased with her job at present time. She still has from time to time considerable left neck and low back pain. Neck pain sounds a bit like radiculopathy because it is described as a burning sensation radiating down into her left upper arm.  Social history: She is married. Has adult children-2 sons and a daughter. Does not smoke. Drinks about 3 glasses of wine at night.  Family history: Father with history of coronary artery disease, diabetes, CABG hypertension. 3 brothers and one sister in good health. Mother in good health.  Past medical history: GYN is Dr. Jennette Kettle who has her on estrogen replacement patch and progesterone.  Intolerant of Biaxin otherwise no known drug allergies.  Had brachial plexus injury 1998, melanoma removed from his back December 2005 with no recurrence. Surgery for strabismus at age 89 and age 12. Tonsillectomy. Cervical disc surgery C5-C6 and C6-C7 August 2000. Uterine ablation per Dr. Jennette Kettle in 2005.  Review of recent lab study shows elevation of SGOT and SGPT which is mild. Has to drinking 3 glasses of wine daily. She also takes Norco for musculoskeletal pain. She's had previous elevation of SGOT and SGPT. See Epic. Patient will cut down the line consumption and have repeat liver functions done in 2 months. She is also to have ultrasound of gallbladder and liver. Suspect she has fatty liver infiltration.    Review of Systems  HENT: Negative.   Eyes: Negative.   Respiratory:  Negative.   Cardiovascular: Negative.   Musculoskeletal: Positive for back pain and neck pain.  Neurological: Negative.   Hematological: Negative.   Psychiatric/Behavioral:       History of depression       Objective:   Physical Exam  Vitals reviewed. Constitutional: She is oriented to person, place, and time. She appears well-developed and well-nourished. No distress.  HENT:  Head: Normocephalic and atraumatic.  Right Ear: External ear normal.  Left Ear: External ear normal.  Mouth/Throat: Oropharynx is clear and moist. No oropharyngeal exudate.  Eyes: Conjunctivae and EOM are normal. Pupils are equal, round, and reactive to light. Right eye exhibits no discharge. Left eye exhibits no discharge. No scleral icterus.  Neck: Neck supple. No JVD present. No thyromegaly present.  Cardiovascular: Normal rate, regular rhythm, normal heart sounds and intact distal pulses.   No murmur heard. Pulmonary/Chest: Effort normal and breath sounds normal. No respiratory distress. She has no wheezes. She has no rales. She exhibits no tenderness.  Breasts normal female  Abdominal: Soft. Bowel sounds are normal. She exhibits no distension and no mass. There is no tenderness. There is no rebound and no guarding.  Genitourinary:  Deferred to GYN  Musculoskeletal: Normal range of motion. She exhibits no edema.  Lymphadenopathy:    She has no cervical adenopathy.  Neurological: She is alert and oriented to person, place, and time. She has normal reflexes. No cranial nerve deficit. Coordination normal.  Skin: Skin is warm and dry. No rash noted. She is not diaphoretic.  Psychiatric: She has a  normal mood and affect. Her behavior is normal. Judgment and thought content normal.          Assessment & Plan:  History of chronic low back and neck pain. Status post cervical spine surgery August 2000. Still has issues with neck pain with radiation down into the left upper arm.  Hypothyroidism-TSH within  normal limits  History of melanoma  Elevated liver functions-decreased line consumption and return in 2 months. Is to have ultrasound of gallbladder and liver  History of fibrocystic breast disease-had lump in left breast in 1995 which resolved spontaneously. She had an ultrasound and saw Dr. Orpah Greek for followup.  History of vitamin D deficiency-vitamin D level today is normal.  Hypertension-stable. Patient says blood pressure at home runs lower than what it is today here  History of GE reflux  History of depression-improved with job change  Elevated liver functions-could be due to Norco, wine consumption, fatty liver or combination of all 3. Patient is to decrease wine consumption from 3 glasses to one glass nightly. Watch Norco consumption for back pain and neck pain.  Try tramadol 50 mg 3 times daily for pain instead of Norco. Reserve Norco for severe pain. However tramadol can cause elevated liver functions I suspect. She will have ultrasound of gallbladder and liver. Return in 2 months for repeat liver panel only. Return in 6 months for office visit, TSH and liver panel. Have prescribed Norco for 30 days (#90) with no refill.

## 2013-04-23 NOTE — Patient Instructions (Signed)
Watch diet. Return in 2 months for LFTs. Have [place] of gallbladder.  Take Norco sparing. Have prescribed Tramadol. Next OV with MD in 6 months

## 2013-04-29 ENCOUNTER — Other Ambulatory Visit: Payer: 59

## 2013-05-14 ENCOUNTER — Other Ambulatory Visit: Payer: Self-pay | Admitting: Internal Medicine

## 2013-05-14 ENCOUNTER — Ambulatory Visit: Admission: RE | Admit: 2013-05-14 | Payer: 59 | Source: Ambulatory Visit

## 2013-05-14 ENCOUNTER — Ambulatory Visit
Admission: RE | Admit: 2013-05-14 | Discharge: 2013-05-14 | Disposition: A | Discharge status: Home / Self Care | Payer: 59 | Source: Ambulatory Visit | Attending: Internal Medicine | Admitting: Internal Medicine

## 2013-05-14 DIAGNOSIS — R7989 Other specified abnormal findings of blood chemistry: Secondary | ICD-10-CM

## 2013-05-22 ENCOUNTER — Telehealth: Payer: Self-pay | Admitting: Internal Medicine

## 2013-05-22 NOTE — Telephone Encounter (Signed)
Called patient to give her results of ultrasound from late October. This was done because of elevated liver functions. Patient has fatty liver infiltration. Pancreas was not well seen because of bowel gas overlying the pancreas. She had elevated liver functions on 04/14/2013. Admits to wine consumption nightly. Was going to cut back on that and return in December for followup. We talked about doing a CT scan to further clarify head of pancreas which was not well seen. However were going to hold off on that and wait until she returns in December and have a recheck of liver functions.

## 2013-06-01 ENCOUNTER — Other Ambulatory Visit: Payer: Self-pay | Admitting: *Deleted

## 2013-06-01 MED ORDER — HYDROCODONE-ACETAMINOPHEN 10-325 MG PO TABS
1.0000 | ORAL_TABLET | Freq: Three times a day (TID) | ORAL | Status: DC | PRN
Start: 2013-06-01 — End: 2013-06-01

## 2013-06-01 MED ORDER — HYDROCODONE-ACETAMINOPHEN 10-325 MG PO TABS: 1.0000 | ORAL_TABLET | Freq: Three times a day (TID) | ORAL | Status: DC | PRN

## 2013-06-26 ENCOUNTER — Other Ambulatory Visit: Payer: 59 | Admitting: Internal Medicine

## 2013-06-26 DIAGNOSIS — Z79899 Other long term (current) drug therapy: Secondary | ICD-10-CM

## 2013-06-26 LAB — HEPATIC FUNCTION PANEL
Albumin: 4 g/dL (ref 3.5–5.2)
Alkaline Phosphatase: 77 U/L (ref 39–117)
Indirect Bilirubin: 0.4 mg/dL (ref 0.0–0.9)
Total Bilirubin: 0.5 mg/dL (ref 0.3–1.2)

## 2013-07-03 ENCOUNTER — Other Ambulatory Visit: Payer: Self-pay | Admitting: Internal Medicine

## 2013-07-07 ENCOUNTER — Telehealth: Payer: Self-pay | Admitting: Internal Medicine

## 2013-07-07 DIAGNOSIS — E039 Hypothyroidism, unspecified: Secondary | ICD-10-CM

## 2013-07-07 MED ORDER — LEVOTHYROXINE SODIUM 150 MCG PO TABS: 150.0000 ug | ORAL_TABLET | Freq: Every day | ORAL | Status: DC

## 2013-07-07 NOTE — Telephone Encounter (Signed)
Patient running out of levothyroxin. Mail-order pharmacy just sent refill today. Call in levothyroxine 150 mcg to [facility]  #30 with 3 refills 1 by mouth daily

## 2013-07-17 ENCOUNTER — Other Ambulatory Visit: Payer: Self-pay | Admitting: Internal Medicine

## 2013-07-20 ENCOUNTER — Other Ambulatory Visit: Payer: Self-pay

## 2013-07-20 MED ORDER — TRAMADOL HCL 50 MG PO TABS: 50.0000 mg | ORAL_TABLET | Freq: Three times a day (TID) | ORAL | Status: DC | PRN

## 2013-08-25 ENCOUNTER — Telehealth: Payer: Self-pay | Admitting: Internal Medicine

## 2013-08-25 NOTE — Telephone Encounter (Signed)
I will do this for her.

## 2013-09-04 ENCOUNTER — Other Ambulatory Visit: Payer: Self-pay

## 2013-09-14 ENCOUNTER — Other Ambulatory Visit: Payer: Self-pay

## 2013-09-14 MED ORDER — HYDROCODONE-ACETAMINOPHEN 10-325 MG PO TABS: 1.0000 | ORAL_TABLET | Freq: Three times a day (TID) | ORAL | Status: DC | PRN

## 2013-09-15 ENCOUNTER — Other Ambulatory Visit: Payer: Self-pay

## 2013-09-15 MED ORDER — HYDROCODONE-ACETAMINOPHEN 10-325 MG PO TABS: 1.0000 | ORAL_TABLET | Freq: Three times a day (TID) | ORAL | Status: DC | PRN

## 2013-10-04 ENCOUNTER — Other Ambulatory Visit: Payer: Self-pay | Admitting: Internal Medicine

## 2013-10-19 ENCOUNTER — Other Ambulatory Visit: Payer: Self-pay

## 2013-10-19 MED ORDER — HYDROCODONE-ACETAMINOPHEN 10-325 MG PO TABS: 1.0000 | ORAL_TABLET | Freq: Three times a day (TID) | ORAL | Status: DC | PRN

## 2013-10-20 ENCOUNTER — Other Ambulatory Visit: Payer: Self-pay

## 2013-10-20 MED ORDER — HYDROCODONE-ACETAMINOPHEN 10-325 MG PO TABS: 1.0000 | ORAL_TABLET | Freq: Three times a day (TID) | ORAL | Status: DC | PRN

## 2013-10-22 ENCOUNTER — Ambulatory Visit (INDEPENDENT_AMBULATORY_CARE_PROVIDER_SITE_OTHER): Payer: 59 | Admitting: Internal Medicine

## 2013-10-22 ENCOUNTER — Encounter: Payer: Self-pay | Admitting: Internal Medicine

## 2013-10-22 VITALS — BP 130/80 | HR 72 | Temp 98.7°F | Resp 16 | Wt 188.0 lb

## 2013-10-22 DIAGNOSIS — R740 Nonspecific elevation of levels of transaminase and lactic acid dehydrogenase [LDH]: Secondary | ICD-10-CM

## 2013-10-22 DIAGNOSIS — M7918 Myalgia, other site: Secondary | ICD-10-CM

## 2013-10-22 DIAGNOSIS — R6889 Other general symptoms and signs: Secondary | ICD-10-CM

## 2013-10-22 DIAGNOSIS — R748 Abnormal levels of other serum enzymes: Secondary | ICD-10-CM

## 2013-10-22 DIAGNOSIS — R7401 Elevation of levels of liver transaminase levels: Secondary | ICD-10-CM

## 2013-10-22 DIAGNOSIS — E039 Hypothyroidism, unspecified: Secondary | ICD-10-CM

## 2013-10-22 DIAGNOSIS — R7402 Elevation of levels of lactic acid dehydrogenase (LDH): Secondary | ICD-10-CM

## 2013-10-22 DIAGNOSIS — IMO0001 Reserved for inherently not codable concepts without codable children: Secondary | ICD-10-CM

## 2013-10-22 LAB — HEPATIC FUNCTION PANEL
ALT: 72 U/L — AB (ref 0–35)
AST: 52 U/L — ABNORMAL HIGH (ref 0–37)
Albumin: 4.1 g/dL (ref 3.5–5.2)
Alkaline Phosphatase: 91 U/L (ref 39–117)
BILIRUBIN INDIRECT: 0.4 mg/dL (ref 0.2–1.2)
Bilirubin, Direct: 0.1 mg/dL (ref 0.0–0.3)
Total Bilirubin: 0.5 mg/dL (ref 0.2–1.2)
Total Protein: 6.9 g/dL (ref 6.0–8.3)

## 2013-10-22 LAB — TSH: TSH: 0.509 u[IU]/mL (ref 0.350–4.500)

## 2013-10-22 MED ORDER — PROMETHAZINE HCL 25 MG PO TABS: 25.0000 mg | ORAL_TABLET | Freq: Three times a day (TID) | ORAL | Status: DC | PRN

## 2013-10-22 NOTE — Progress Notes (Signed)
   Subjective:    Patient ID: Diana House, female    DOB: 1955-08-24, 58 y.o.   MRN: [id number]  HPI For 6 month recheck on depression, musculoskeletal pain, hypothyroidism. She's had a recent respiratory infection with some cough. Doesn't feel she needs antibiotics. Feels well. 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 She seems very happy with her job situation. Feels well without complaints today.    Review of Systems     Objective:   Physical Exam Spent 15 minutes speaking with patient. No thyromegaly.       Assessment & Plan:  Hypothyroidism-TSH drawn today  History of elevated liver functions-liver panel drawn today. See discussion below  URI-does not want antibiotics  Musculoskeletal pain-chronic neck pain  Plan: Return in 6 months for physical exam  Addendum: Persistent mild elevation of liver functions. Does drink wine nightly. Says she has cut back. We'll do additional tests for liver disease including hep C antibody, AMA, ferritin iron iron binding capacity. She has seen Dr. Earlean Shawl in the past and we will refer her to Dr. Earlean Shawl once these tests are back. She does take paramount of hydrocodone/APAP usually 2-1/2 tablets daily. That could be causing some mild elevation of liver functions.

## 2013-10-22 NOTE — Patient Instructions (Addendum)
TSH is within normal limits. Continue same dose of Synthroid. Decrease wine consumption. Additional liver testing has been done today. Refer to Dr. Earlean Shawl for evaluation.

## 2013-10-23 ENCOUNTER — Other Ambulatory Visit (INDEPENDENT_AMBULATORY_CARE_PROVIDER_SITE_OTHER): Payer: 59 | Admitting: Internal Medicine

## 2013-10-23 DIAGNOSIS — R945 Abnormal results of liver function studies: Principal | ICD-10-CM

## 2013-10-23 DIAGNOSIS — R7989 Other specified abnormal findings of blood chemistry: Secondary | ICD-10-CM

## 2013-10-23 LAB — LIPID PANEL
CHOLESTEROL: 183 mg/dL (ref 0–200)
HDL: 59 mg/dL (ref >39)
LDL Cholesterol: 94 mg/dL (ref 0–99)
Total CHOL/HDL Ratio: 3.1 Ratio
Triglycerides: 150 mg/dL — ABNORMAL HIGH (ref <150)
VLDL: 30 mg/dL (ref 0–40)

## 2013-10-23 LAB — IRON AND TIBC
%SAT: 44 % (ref 20–55)
Iron: 129 ug/dL (ref 42–145)
TIBC: 295 ug/dL (ref 250–470)
UIBC: 166 ug/dL (ref 125–400)

## 2013-10-23 LAB — HEPATITIS C ANTIBODY: HCV Ab: NEGATIVE

## 2013-10-23 LAB — HEPATITIS B SURFACE ANTIBODY, QUANTITATIVE: Hepatitis B-Post: 28.7 m[IU]/mL

## 2013-10-23 NOTE — Progress Notes (Signed)
Serum is QNS. Patient will come in this pm for redraw of blood for additional lab tests.

## 2013-10-23 NOTE — Addendum Note (Signed)
Addended by: Brett Canales on: 10/23/2013 04:32 PM   Modules accepted: Orders

## 2013-10-24 LAB — FERRITIN: Ferritin: 437 ng/mL — ABNORMAL HIGH (ref 10–291)

## 2013-10-26 LAB — MITOCHONDRIAL ANTIBODIES: Mitochondrial M2 Ab, IgG: 0.45 (ref <0.91)

## 2013-11-02 ENCOUNTER — Telehealth: Payer: Self-pay

## 2013-11-02 NOTE — Telephone Encounter (Signed)
Patient scheduled for an appointment with Dr. Earlean Shawl on 12/18/2013 at 9:15 am. She is aware of this.

## 2013-12-23 ENCOUNTER — Other Ambulatory Visit: Payer: Self-pay | Admitting: Internal Medicine

## 2014-01-28 ENCOUNTER — Other Ambulatory Visit: Payer: Self-pay

## 2014-01-28 MED ORDER — HYDROCODONE-ACETAMINOPHEN 10-325 MG PO TABS: 1.0000 | ORAL_TABLET | Freq: Three times a day (TID) | ORAL | Status: DC | PRN

## 2014-02-10 ENCOUNTER — Other Ambulatory Visit: Payer: Self-pay | Admitting: Internal Medicine

## 2014-03-09 ENCOUNTER — Other Ambulatory Visit: Payer: Self-pay | Admitting: Internal Medicine

## 2014-04-27 ENCOUNTER — Other Ambulatory Visit: Payer: 59 | Admitting: Internal Medicine

## 2014-04-27 DIAGNOSIS — E038 Other specified hypothyroidism: Secondary | ICD-10-CM

## 2014-04-27 DIAGNOSIS — Z0000 Encounter for general adult medical examination without abnormal findings: Secondary | ICD-10-CM

## 2014-04-27 DIAGNOSIS — E559 Vitamin D deficiency, unspecified: Secondary | ICD-10-CM

## 2014-04-27 DIAGNOSIS — I10 Essential (primary) hypertension: Secondary | ICD-10-CM

## 2014-04-27 LAB — BASIC METABOLIC PANEL
BUN: 10 mg/dL (ref 6–23)
CALCIUM: 9.3 mg/dL (ref 8.4–10.5)
CO2: 29 mEq/L (ref 19–32)
Chloride: 99 mEq/L (ref 96–112)
Creat: 0.62 mg/dL (ref 0.50–1.10)
Glucose, Bld: 102 mg/dL — ABNORMAL HIGH (ref 70–99)
Potassium: 3.7 mEq/L (ref 3.5–5.3)
SODIUM: 139 meq/L (ref 135–145)

## 2014-04-27 LAB — CBC WITH DIFFERENTIAL/PLATELET
BASOS ABS: 0.1 10*3/uL (ref 0.0–0.1)
Basophils Relative: 1 % (ref 0–1)
Eosinophils Absolute: 0.4 10*3/uL (ref 0.0–0.7)
Eosinophils Relative: 7 % — ABNORMAL HIGH (ref 0–5)
HCT: 37.1 % (ref 36.0–46.0)
HEMOGLOBIN: 12.8 g/dL (ref 12.0–15.0)
LYMPHS PCT: 34 % (ref 12–46)
Lymphs Abs: 2 10*3/uL (ref 0.7–4.0)
MCH: 32.5 pg (ref 26.0–34.0)
MCHC: 34.5 g/dL (ref 30.0–36.0)
MCV: 94.2 fL (ref 78.0–100.0)
Monocytes Absolute: 0.5 10*3/uL (ref 0.1–1.0)
Monocytes Relative: 8 % (ref 3–12)
NEUTROS ABS: 3 10*3/uL (ref 1.7–7.7)
Neutrophils Relative %: 50 % (ref 43–77)
Platelets: 272 10*3/uL (ref 150–400)
RBC: 3.94 MIL/uL (ref 3.87–5.11)
RDW: 13 % (ref 11.5–15.5)
WBC: 6 10*3/uL (ref 4.0–10.5)

## 2014-04-27 LAB — TSH: TSH: 0.016 u[IU]/mL — ABNORMAL LOW (ref 0.350–4.500)

## 2014-04-27 LAB — LIPID PANEL
Cholesterol: 159 mg/dL (ref 0–200)
HDL: 53 mg/dL (ref >39)
LDL Cholesterol: 82 mg/dL (ref 0–99)
TRIGLYCERIDES: 122 mg/dL (ref <150)
Total CHOL/HDL Ratio: 3 Ratio
VLDL: 24 mg/dL (ref 0–40)

## 2014-04-28 LAB — VITAMIN D 25 HYDROXY (VIT D DEFICIENCY, FRACTURES): Vit D, 25-Hydroxy: 56 ng/mL (ref 30–89)

## 2014-04-29 ENCOUNTER — Encounter: Payer: Self-pay | Admitting: Internal Medicine

## 2014-04-29 ENCOUNTER — Ambulatory Visit (INDEPENDENT_AMBULATORY_CARE_PROVIDER_SITE_OTHER): Payer: 59 | Admitting: Internal Medicine

## 2014-04-29 VITALS — BP 100/68 | HR 60 | Temp 97.9°F | Resp 14 | Ht 65.0 in | Wt 169.0 lb

## 2014-04-29 DIAGNOSIS — N6019 Diffuse cystic mastopathy of unspecified breast: Secondary | ICD-10-CM

## 2014-04-29 DIAGNOSIS — G8929 Other chronic pain: Secondary | ICD-10-CM

## 2014-04-29 DIAGNOSIS — Z85820 Personal history of malignant melanoma of skin: Secondary | ICD-10-CM

## 2014-04-29 DIAGNOSIS — I10 Essential (primary) hypertension: Secondary | ICD-10-CM

## 2014-04-29 DIAGNOSIS — K219 Gastro-esophageal reflux disease without esophagitis: Secondary | ICD-10-CM

## 2014-04-29 DIAGNOSIS — E039 Hypothyroidism, unspecified: Secondary | ICD-10-CM

## 2014-04-29 DIAGNOSIS — Z0000 Encounter for general adult medical examination without abnormal findings: Secondary | ICD-10-CM

## 2014-04-29 DIAGNOSIS — Z8639 Personal history of other endocrine, nutritional and metabolic disease: Secondary | ICD-10-CM

## 2014-04-29 DIAGNOSIS — R748 Abnormal levels of other serum enzymes: Secondary | ICD-10-CM

## 2014-04-29 DIAGNOSIS — M542 Cervicalgia: Secondary | ICD-10-CM

## 2014-04-29 DIAGNOSIS — Z8659 Personal history of other mental and behavioral disorders: Secondary | ICD-10-CM

## 2014-04-29 DIAGNOSIS — M549 Dorsalgia, unspecified: Secondary | ICD-10-CM

## 2014-04-29 LAB — POCT URINALYSIS DIPSTICK
Bilirubin, UA: NEGATIVE
Blood, UA: NEGATIVE
GLUCOSE UA: NEGATIVE
KETONES UA: NEGATIVE
LEUKOCYTES UA: NEGATIVE
Nitrite, UA: NEGATIVE
PROTEIN UA: NEGATIVE
Urobilinogen, UA: NEGATIVE
pH, UA: 7.5

## 2014-04-29 MED ORDER — HYDROCODONE-ACETAMINOPHEN 10-325 MG PO TABS: 1.0000 | ORAL_TABLET | Freq: Three times a day (TID) | ORAL | Status: DC | PRN

## 2014-04-29 MED ORDER — HYDROCHLOROTHIAZIDE 25 MG PO TABS: ORAL_TABLET | ORAL | Status: DC

## 2014-04-29 MED ORDER — BUPROPION HCL ER (XL) 300 MG PO TB24: ORAL_TABLET | ORAL | Status: DC

## 2014-04-29 MED ORDER — ESCITALOPRAM OXALATE 10 MG PO TABS: ORAL_TABLET | ORAL | Status: DC

## 2014-04-29 MED ORDER — LEVOTHYROXINE SODIUM 150 MCG PO TABS: ORAL_TABLET | ORAL | Status: DC

## 2014-06-02 ENCOUNTER — Other Ambulatory Visit: Payer: Self-pay | Admitting: Internal Medicine

## 2014-06-02 ENCOUNTER — Telehealth: Payer: Self-pay | Admitting: Internal Medicine

## 2014-06-02 MED ORDER — HYDROCODONE-ACETAMINOPHEN 10-325 MG PO TABS: 1.0000 | ORAL_TABLET | Freq: Three times a day (TID) | ORAL | Status: DC | PRN

## 2014-06-02 NOTE — Telephone Encounter (Signed)
Notified patient she may pick up Rx on Thursday 9-1 or 2-5.

## 2014-06-02 NOTE — Progress Notes (Signed)
Written Norco 10/325 #180 one po q 8 hours prn pain

## 2014-06-02 NOTE — Telephone Encounter (Signed)
Needs refill on Rx Vicodin 10-325 mg.  Take 1 every 8 hours as needed for pain.  Pick by Friday.  Please contact patient when ready.

## 2014-06-14 NOTE — Patient Instructions (Signed)
Continue same medications and return in 6 months 

## 2014-06-14 NOTE — Progress Notes (Signed)
   Subjective:    Patient ID: Diana House, female    DOB: 14-Jan-1956, 58 y.o.   MRN: [id number]  HPI  58 year old white female in today for health maintenance and evaluation of medical issues. History of hypertension, fibrocystic breast disease, depression, GE reflux, history of malignant melanoma without recurrence, hypothyroidism, vitamin D deficiency, chronic musculoskeletal pain in back and neck. Received flu vaccine through employment. History of elevated liver enzymes. Saw Dr. Earlean Shawl. These improved after cutting back on alcohol consumption. History of cervical disc disease.  GYN is Dr. Nori Riis who has her on an estrogen replacement patch and progesterone.  Intolerant of Biaxin otherwise no known drug allergies.  Patient had a brachial plexus injury 1998, melanoma removed from back the severed 2005. Surgery for strabismus at age 7 and at age 27. Tonsillectomy. Cervical disc surgery C5-C6 and C6-C7 in August 2000. Uterine ablation per Dr. Nori Riis in 2005.  Social history: She is married. Has adult children 2 sons and a daughter. One son is an Pension scheme manager in [place]. She does not smoke. Social alcohol consumption.  Family history: Father with history of coronary artery disease diabetes CABG and hypertension. 3 brothers and one sister in good health. Mother in good health.    Review of Systems  Cardiovascular:       Hypertension  Gastrointestinal:       History of elevated liver enzymes, GERD  Skin:       History of melanoma on her back  Neurological:       History of cervical disc disease with neck and back pain  Psychiatric/Behavioral:       History of depression       Objective:   Physical Exam  Constitutional: She is oriented to person, place, and time. She appears well-developed and well-nourished. No distress.  HENT:  Head: Normocephalic and atraumatic.  Right Ear: External ear normal.  Left Ear: External ear normal.  Mouth/Throat: Oropharynx is clear and moist. No oropharyngeal  exudate.  Eyes: Conjunctivae are normal. Pupils are equal, round, and reactive to light. Right eye exhibits no discharge. Left eye exhibits no discharge. No scleral icterus.  Neck: Neck supple. No JVD present. No thyromegaly present.  Cardiovascular: Normal rate, regular rhythm, normal heart sounds and intact distal pulses.   No murmur heard. Pulmonary/Chest: Effort normal and breath sounds normal. No respiratory distress. She has no wheezes. She has no rales.  Breasts normal female  Abdominal: Soft. Bowel sounds are normal. She exhibits no distension and no mass. There is no tenderness. There is no rebound and no guarding.  Genitourinary:  Deferred to Dr. Nori Riis  Musculoskeletal: Normal range of motion. She exhibits no edema.  Neurological: She is alert and oriented to person, place, and time. She has normal reflexes. No cranial nerve deficit. Coordination normal.  Skin: Skin is warm and dry. No rash noted. She is not diaphoretic.  Psychiatric: She has a normal mood and affect. Her behavior is normal. Thought content normal.  Vitals reviewed.         Assessment & Plan:  Hypertension-stable  Hypothyroidism-stable  Chronic neck and back pain-treated with Norco  History of melanoma  History of elevated liver functions improved with decreasing alcohol consumption  History of fibrocystic breast disease  History of vitamin D deficiency  History of GE reflux  History of depression-stable  Plan: Continue same medications and return in 6 months

## 2014-06-28 ENCOUNTER — Telehealth: Payer: Self-pay | Admitting: Internal Medicine

## 2014-06-28 MED ORDER — HYDROCODONE-ACETAMINOPHEN 10-325 MG PO TABS: 1.0000 | ORAL_TABLET | Freq: Three times a day (TID) | ORAL | Status: DC | PRN

## 2014-06-28 NOTE — Telephone Encounter (Signed)
Can we give her #90 with 2 additional refills of #90 each? Call pharmcy and see how they want to handle since last time pt. was displeased.

## 2014-06-28 NOTE — Telephone Encounter (Signed)
Patients insurance will only pay for 30 day supply of vicodin, this is why she only got 82 last time.  3 rx printed for patient to pick up.   Patient aware.

## 2014-06-28 NOTE — Telephone Encounter (Signed)
Needs to get a refill on her Vicodin.  Last time we wrote for 180 count; however, the pharmacy would only fill #93.  We have been giving her #90 and giving her 3 Rx at a time.  Can we please give her maybe #90 and 3 months worth so she doesn't have to come back for 3 months?    Please advise.  And, can you call when ready to pick up?

## 2014-07-19 LAB — HM MAMMOGRAPHY

## 2014-09-23 ENCOUNTER — Other Ambulatory Visit: Payer: Self-pay

## 2014-09-24 LAB — CYTOLOGY - PAP

## 2014-09-28 ENCOUNTER — Other Ambulatory Visit: Payer: Self-pay | Admitting: *Deleted

## 2014-09-28 ENCOUNTER — Telehealth: Payer: Self-pay | Admitting: Internal Medicine

## 2014-09-28 MED ORDER — HYDROCODONE-ACETAMINOPHEN 10-325 MG PO TABS: 1.0000 | ORAL_TABLET | Freq: Three times a day (TID) | ORAL | Status: DC | PRN

## 2014-09-28 NOTE — Telephone Encounter (Signed)
Needs refill on her Norco 10-325.  She takes 1 every 8 hours.  She would need to pick up Rx by Thursday.  Please call when ready to pick up.

## 2014-09-28 NOTE — Telephone Encounter (Signed)
Please refill as before

## 2014-09-28 NOTE — Telephone Encounter (Signed)
Hydrocodone script printed for Dr Renold Genta to sign

## 2014-09-29 NOTE — Telephone Encounter (Signed)
LMOM for patient that her Rx is ready for her to pick up and left hours of operation.

## 2014-10-18 ENCOUNTER — Other Ambulatory Visit: Payer: Self-pay | Admitting: Internal Medicine

## 2014-10-18 ENCOUNTER — Other Ambulatory Visit: Payer: 59 | Admitting: Internal Medicine

## 2014-10-18 DIAGNOSIS — Z79899 Other long term (current) drug therapy: Secondary | ICD-10-CM

## 2014-10-18 DIAGNOSIS — E039 Hypothyroidism, unspecified: Secondary | ICD-10-CM

## 2014-10-18 LAB — HEPATIC FUNCTION PANEL
ALBUMIN: 4.1 g/dL (ref 3.5–5.2)
ALT: 20 U/L (ref 0–35)
AST: 20 U/L (ref 0–37)
Alkaline Phosphatase: 71 U/L (ref 39–117)
Bilirubin, Direct: 0.1 mg/dL (ref 0.0–0.3)
Indirect Bilirubin: 0.3 mg/dL (ref 0.2–1.2)
Total Bilirubin: 0.4 mg/dL (ref 0.2–1.2)
Total Protein: 6.9 g/dL (ref 6.0–8.3)

## 2014-10-18 LAB — TSH: TSH: 0.111 u[IU]/mL — AB (ref 0.350–4.500)

## 2014-10-19 ENCOUNTER — Ambulatory Visit (INDEPENDENT_AMBULATORY_CARE_PROVIDER_SITE_OTHER): Payer: 59 | Admitting: Internal Medicine

## 2014-10-19 ENCOUNTER — Encounter: Payer: Self-pay | Admitting: Internal Medicine

## 2014-10-19 VITALS — BP 122/74 | HR 75 | Temp 97.8°F | Wt 171.0 lb

## 2014-10-19 DIAGNOSIS — R002 Palpitations: Secondary | ICD-10-CM | POA: Diagnosis not present

## 2014-10-19 LAB — BASIC METABOLIC PANEL
BUN: 13 mg/dL (ref 6–23)
CHLORIDE: 97 meq/L (ref 96–112)
CO2: 27 mEq/L (ref 19–32)
Calcium: 9.3 mg/dL (ref 8.4–10.5)
Creat: 0.69 mg/dL (ref 0.50–1.10)
Glucose, Bld: 90 mg/dL (ref 70–99)
POTASSIUM: 4 meq/L (ref 3.5–5.3)
Sodium: 138 mEq/L (ref 135–145)

## 2014-10-19 MED ORDER — LEVOTHYROXINE SODIUM 125 MCG PO TABS: 125.0000 ug | ORAL_TABLET | Freq: Every day | ORAL | Status: DC

## 2014-10-19 MED ORDER — HYDROCODONE-ACETAMINOPHEN 10-325 MG PO TABS: 1.0000 | ORAL_TABLET | Freq: Three times a day (TID) | ORAL | Status: DC | PRN

## 2014-10-19 NOTE — Progress Notes (Signed)
   Subjective:    Patient ID: Diana House, female    DOB: 11-13-1955, 59 y.o.   MRN: [id number]  HPI  Patient in today for six-month recheck. History of hypothyroidism currently on Synthroid 0.15 mg daily. TSH is low on that dose at 0.111. We will decrease dose to 0.125 mg daily and recheck in 8-12 weeks. Her liver functions are now normal. She has seen Dr. Rosalin Hawking for elevated liver functions in the past. They have improved with decreased alcohol consumption. She also complaining of palpitations which she has had off and on for years. EKG shows a rare PAC otherwise normal. She's going to wear a 24-hour Holter monitor and we will evaluate that result. Otherwise she is feeling pretty well. Needs a refill on Norco for chronic pain. 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 Depression symptoms are stable on antidepressant.    Review of Systems     Objective:   Physical Exam  Skin warm and dry. Nodes none. No thyromegaly. Chest clear to auscultation. Cardiac exam regular rate and rhythm without murmur or ectopy. Extremities without edema.      Assessment & Plan:  Palpitations-to wear 24-hour Holter monitor. Given prescription for Lopressor 25 mg tablets 1/2-1 by mouth twice daily if needed for palpitations. Check potassium  Hypothyroidism-decrease Synthroid to 0.125 mg daily and recheck TSH in 6-8 weeks  Elevated liver functions-now normal  Chronic pain-refill Norco. Have evaluated use and it is appropriate and not excessive  Hypertension-stable on diuretic  Depression-stable on SSRI and Wellbutrin  Plan: Will be due for physical exam in 6 months.

## 2014-10-19 NOTE — Patient Instructions (Signed)
Norco has been refilled. Synthroid changed to 0.125 mg daily. Recheck TSH with office visit in 8-12 weeks. Lopressor if needed for palpitations. Wear 24-hour Holter monitor as outpatient.

## 2014-10-26 ENCOUNTER — Encounter: Payer: Self-pay | Admitting: *Deleted

## 2014-10-26 ENCOUNTER — Encounter (INDEPENDENT_AMBULATORY_CARE_PROVIDER_SITE_OTHER): Payer: 59

## 2014-10-26 DIAGNOSIS — R002 Palpitations: Secondary | ICD-10-CM | POA: Diagnosis not present

## 2014-10-26 NOTE — Progress Notes (Signed)
Patient ID: Diana House, female   DOB: Sep 09, 1955, 59 y.o.   MRN: [id number] [facility] 24 hour holter monitor applied to patient.

## 2014-11-24 ENCOUNTER — Other Ambulatory Visit: Payer: Self-pay | Admitting: Internal Medicine

## 2014-11-24 NOTE — Telephone Encounter (Signed)
Refill once 

## 2014-11-25 ENCOUNTER — Other Ambulatory Visit: Payer: Self-pay | Admitting: *Deleted

## 2014-11-25 MED ORDER — HYDROCODONE-ACETAMINOPHEN 10-325 MG PO TABS: 1.0000 | ORAL_TABLET | Freq: Three times a day (TID) | ORAL | Status: DC | PRN

## 2014-12-21 ENCOUNTER — Other Ambulatory Visit: Payer: Self-pay | Admitting: Internal Medicine

## 2014-12-21 NOTE — Telephone Encounter (Signed)
Refill once and refer to pain management

## 2014-12-22 ENCOUNTER — Other Ambulatory Visit: Payer: Self-pay | Admitting: *Deleted

## 2014-12-22 MED ORDER — HYDROCODONE-ACETAMINOPHEN 10-325 MG PO TABS: 1.0000 | ORAL_TABLET | Freq: Three times a day (TID) | ORAL | Status: DC | PRN

## 2014-12-22 NOTE — Telephone Encounter (Signed)
Printed script for hydrocodone information for pain management referral to be given to patient when she picks up script

## 2014-12-27 ENCOUNTER — Other Ambulatory Visit: Payer: 59 | Admitting: Internal Medicine

## 2014-12-28 ENCOUNTER — Ambulatory Visit: Payer: 59 | Admitting: Internal Medicine

## 2015-01-03 ENCOUNTER — Other Ambulatory Visit: Payer: 59 | Admitting: Internal Medicine

## 2015-01-03 DIAGNOSIS — E039 Hypothyroidism, unspecified: Secondary | ICD-10-CM

## 2015-01-03 LAB — TSH: TSH: 0.944 u[IU]/mL (ref 0.350–4.500)

## 2015-01-04 ENCOUNTER — Telehealth: Payer: Self-pay | Admitting: *Deleted

## 2015-01-04 ENCOUNTER — Ambulatory Visit (INDEPENDENT_AMBULATORY_CARE_PROVIDER_SITE_OTHER): Payer: 59 | Admitting: Internal Medicine

## 2015-01-04 ENCOUNTER — Encounter: Payer: Self-pay | Admitting: Internal Medicine

## 2015-01-04 VITALS — BP 120/72 | HR 76 | Temp 98.0°F | Wt 170.0 lb

## 2015-01-04 DIAGNOSIS — E039 Hypothyroidism, unspecified: Secondary | ICD-10-CM

## 2015-01-04 DIAGNOSIS — M542 Cervicalgia: Secondary | ICD-10-CM

## 2015-01-04 DIAGNOSIS — M549 Dorsalgia, unspecified: Secondary | ICD-10-CM

## 2015-01-04 DIAGNOSIS — G8929 Other chronic pain: Secondary | ICD-10-CM

## 2015-01-04 NOTE — Telephone Encounter (Signed)
Reviewed lab results with patient.

## 2015-01-09 NOTE — Patient Instructions (Signed)
Continue same dose of thyroid replacement. Continue to take narcotic pain medication sparingly. Please look into pain management clinic

## 2015-01-09 NOTE — Progress Notes (Signed)
   Subjective:    Patient ID: Diana House, female    DOB: 08-21-1955, 59 y.o.   MRN: [id number]  HPI Here today to follow-up on hypothyroidism and chronic neck pain requiring  chronic narcotic pain medication. Cervical disc surgery in 2000. Also has chronic low back pain. 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 Likes the job.  No new complaints. Will be referred to pain management clinic regarding narcotic pain medication. She feels that she needs  pain medication in order to function daily. Explained to her that there might be better alternatives then narcotics that the pain management clinic could employ. She is willing to consider it but hasn't looked into a pain management clinic yet.  Review of Systems     Objective:   Physical Exam  No thyromegaly      Assessment & Plan:  Hypothyroidism-TSH stable on thyroid replacement therapy  Chronic neck pain  Chronic back pain  Plan: She will continue be provided with narcotic pain medication until she establishes with pain management clinic which I will expect to be in the next 3 months. Return in 6 months for physical examination.

## 2015-01-12 ENCOUNTER — Other Ambulatory Visit: Payer: Self-pay | Admitting: Internal Medicine

## 2015-01-28 ENCOUNTER — Telehealth: Payer: Self-pay | Admitting: Internal Medicine

## 2015-01-28 MED ORDER — HYDROCODONE-ACETAMINOPHEN 10-325 MG PO TABS: 1.0000 | ORAL_TABLET | Freq: Three times a day (TID) | ORAL | Status: DC | PRN

## 2015-01-28 NOTE — Telephone Encounter (Signed)
Patient aware she needs to be seen at pain management clinic

## 2015-01-28 NOTE — Telephone Encounter (Signed)
Pt called for refill on HYDROcodone-acetaminophen (NORCO) 10-325 MG per tablet .  Spoke with pt to inform that Dr. Renold Genta will refill once.  However, she will need to go to pain clinic for additional refills.  Pt would like to pick up on Monday, 02/07/15 / lt

## 2015-01-28 NOTE — Telephone Encounter (Signed)
Refill once. Remind pt she is to find chronic pain clinic

## 2015-02-01 ENCOUNTER — Telehealth: Payer: Self-pay

## 2015-02-01 NOTE — Telephone Encounter (Signed)
Left message informing patient to have her yearly mammogram done.

## 2015-02-02 ENCOUNTER — Telehealth: Payer: Self-pay | Admitting: Internal Medicine

## 2015-02-02 NOTE — Telephone Encounter (Signed)
Patient in to pick up her pain med Rx.  She inquired as to her status with referral to pain management.  I spoke with Thurmond Butts today at [facility].  He advised that they DID receive the referral and she is still in the review process.  They are scheduling at the end of August/first part of September.  He couldn't provide a concrete yes or provide a date of appointment.  However, he did confirm receipt of the referral of her information.    Called patient to advise of this information.  Patient just wanted to make sure Dr. Renold Genta knew that she HAD done her part and wasn't just sitting and doing nothing about the referral.  It's a LONG and cumbersome waiting process with the pain management office.  Patient verbalized understanding and I advised would make Dr. Renold Genta aware as well.

## 2015-02-08 ENCOUNTER — Encounter: Payer: Self-pay | Admitting: *Deleted

## 2015-02-18 ENCOUNTER — Other Ambulatory Visit: Payer: Self-pay | Admitting: Internal Medicine

## 2015-02-28 ENCOUNTER — Telehealth: Payer: Self-pay | Admitting: Internal Medicine

## 2015-02-28 MED ORDER — HYDROCODONE-ACETAMINOPHEN 10-325 MG PO TABS: 1.0000 | ORAL_TABLET | Freq: Three times a day (TID) | ORAL | Status: DC | PRN

## 2015-02-28 NOTE — Telephone Encounter (Signed)
Please check and see if OK to refill once

## 2015-04-04 ENCOUNTER — Telehealth: Payer: Self-pay | Admitting: Internal Medicine

## 2015-04-04 NOTE — Telephone Encounter (Signed)
Patient needs refill on her Norco 10-325 mg.  This should be the last request.  She does have appointment with Pain Management on 10/6.  She'd like to pick up Rx on Wed afternoon please.  Sent request by My Chart.  I have replied to that request.    Please call patient when ready to pick up.  Thanks.

## 2015-04-06 ENCOUNTER — Other Ambulatory Visit: Payer: Self-pay | Admitting: *Deleted

## 2015-04-06 MED ORDER — HYDROCODONE-ACETAMINOPHEN 10-325 MG PO TABS: 1.0000 | ORAL_TABLET | Freq: Three times a day (TID) | ORAL | Status: DC | PRN

## 2015-04-06 NOTE — Telephone Encounter (Signed)
Please refill once 

## 2015-04-06 NOTE — Telephone Encounter (Signed)
Hydrocodone RX printed.

## 2015-04-29 ENCOUNTER — Other Ambulatory Visit: Payer: Self-pay | Admitting: Physical Medicine and Rehabilitation

## 2015-04-29 DIAGNOSIS — M542 Cervicalgia: Secondary | ICD-10-CM

## 2015-04-29 DIAGNOSIS — M79602 Pain in left arm: Secondary | ICD-10-CM

## 2015-05-05 ENCOUNTER — Other Ambulatory Visit: Payer: 59 | Admitting: Internal Medicine

## 2015-05-05 DIAGNOSIS — E559 Vitamin D deficiency, unspecified: Secondary | ICD-10-CM

## 2015-05-05 DIAGNOSIS — I10 Essential (primary) hypertension: Secondary | ICD-10-CM

## 2015-05-05 DIAGNOSIS — Z0000 Encounter for general adult medical examination without abnormal findings: Secondary | ICD-10-CM

## 2015-05-05 DIAGNOSIS — Z130 Encounter for screening for diseases of the blood and blood-forming organs and certain disorders involving the immune mechanism: Secondary | ICD-10-CM

## 2015-05-05 DIAGNOSIS — Z13220 Encounter for screening for lipoid disorders: Secondary | ICD-10-CM

## 2015-05-05 DIAGNOSIS — E039 Hypothyroidism, unspecified: Secondary | ICD-10-CM

## 2015-05-05 LAB — CBC WITH DIFFERENTIAL/PLATELET
BASOS ABS: 0 10*3/uL (ref 0.0–0.1)
Basophils Relative: 0 % (ref 0–1)
EOS PCT: 8 % — AB (ref 0–5)
Eosinophils Absolute: 0.5 10*3/uL (ref 0.0–0.7)
HCT: 35.3 % — ABNORMAL LOW (ref 36.0–46.0)
Hemoglobin: 11.9 g/dL — ABNORMAL LOW (ref 12.0–15.0)
LYMPHS ABS: 2.3 10*3/uL (ref 0.7–4.0)
Lymphocytes Relative: 39 % (ref 12–46)
MCH: 33.3 pg (ref 26.0–34.0)
MCHC: 33.7 g/dL (ref 30.0–36.0)
MCV: 98.9 fL (ref 78.0–100.0)
MPV: 10.6 fL (ref 8.6–12.4)
Monocytes Absolute: 0.5 10*3/uL (ref 0.1–1.0)
Monocytes Relative: 8 % (ref 3–12)
NEUTROS PCT: 45 % (ref 43–77)
Neutro Abs: 2.6 10*3/uL (ref 1.7–7.7)
PLATELETS: 306 10*3/uL (ref 150–400)
RBC: 3.57 MIL/uL — AB (ref 3.87–5.11)
RDW: 12.9 % (ref 11.5–15.5)
WBC: 5.8 10*3/uL (ref 4.0–10.5)

## 2015-05-05 LAB — LIPID PANEL
CHOL/HDL RATIO: 3 ratio (ref <=5.0)
Cholesterol: 151 mg/dL (ref 125–200)
HDL: 51 mg/dL (ref >=46)
LDL CALC: 74 mg/dL (ref <130)
TRIGLYCERIDES: 130 mg/dL (ref <150)
VLDL: 26 mg/dL (ref <30)

## 2015-05-05 LAB — COMPLETE METABOLIC PANEL WITH GFR
ALT: 21 U/L (ref 6–29)
AST: 19 U/L (ref 10–35)
Albumin: 3.9 g/dL (ref 3.6–5.1)
Alkaline Phosphatase: 57 U/L (ref 33–130)
BILIRUBIN TOTAL: 0.3 mg/dL (ref 0.2–1.2)
BUN: 11 mg/dL (ref 7–25)
CO2: 29 mmol/L (ref 20–31)
CREATININE: 0.57 mg/dL (ref 0.50–1.05)
Calcium: 8.5 mg/dL — ABNORMAL LOW (ref 8.6–10.4)
Chloride: 98 mmol/L (ref 98–110)
GFR, Est African American: >89 mL/min (ref >=60)
GFR, Est Non African American: >89 mL/min (ref >=60)
GLUCOSE: 81 mg/dL (ref 65–99)
Potassium: 4.1 mmol/L (ref 3.5–5.3)
Sodium: 135 mmol/L (ref 135–146)
TOTAL PROTEIN: 6.3 g/dL (ref 6.1–8.1)

## 2015-05-06 ENCOUNTER — Ambulatory Visit
Admission: RE | Admit: 2015-05-06 | Discharge: 2015-05-06 | Disposition: A | Discharge status: Home / Self Care | Payer: 59 | Source: Ambulatory Visit | Attending: Physical Medicine and Rehabilitation | Admitting: Physical Medicine and Rehabilitation

## 2015-05-06 ENCOUNTER — Encounter: Payer: 59 | Admitting: Internal Medicine

## 2015-05-06 DIAGNOSIS — M79602 Pain in left arm: Secondary | ICD-10-CM

## 2015-05-06 DIAGNOSIS — M542 Cervicalgia: Secondary | ICD-10-CM

## 2015-05-06 LAB — VITAMIN D 25 HYDROXY (VIT D DEFICIENCY, FRACTURES): VIT D 25 HYDROXY: 28 ng/mL — AB (ref 30–100)

## 2015-05-06 LAB — TSH: TSH: 1.116 u[IU]/mL (ref 0.350–4.500)

## 2015-05-06 MED ORDER — GADOBENATE DIMEGLUMINE 529 MG/ML IV SOLN
15.0000 mL | Freq: Once | INTRAVENOUS | Status: AC | PRN
  Administered 2015-05-06: 15 mL via INTRAVENOUS

## 2015-05-16 ENCOUNTER — Other Ambulatory Visit: Payer: Self-pay | Admitting: Internal Medicine

## 2015-05-27 ENCOUNTER — Ambulatory Visit (INDEPENDENT_AMBULATORY_CARE_PROVIDER_SITE_OTHER): Payer: 59 | Admitting: Internal Medicine

## 2015-05-27 ENCOUNTER — Encounter: Payer: Self-pay | Admitting: Internal Medicine

## 2015-05-27 VITALS — BP 126/72 | HR 72 | Temp 97.3°F | Resp 18 | Ht 65.0 in | Wt 174.5 lb

## 2015-05-27 DIAGNOSIS — E559 Vitamin D deficiency, unspecified: Secondary | ICD-10-CM | POA: Diagnosis not present

## 2015-05-27 DIAGNOSIS — K219 Gastro-esophageal reflux disease without esophagitis: Secondary | ICD-10-CM | POA: Diagnosis not present

## 2015-05-27 DIAGNOSIS — E039 Hypothyroidism, unspecified: Secondary | ICD-10-CM

## 2015-05-27 DIAGNOSIS — Z79890 Hormone replacement therapy (postmenopausal): Secondary | ICD-10-CM

## 2015-05-27 DIAGNOSIS — M542 Cervicalgia: Secondary | ICD-10-CM

## 2015-05-27 DIAGNOSIS — F329 Major depressive disorder, single episode, unspecified: Secondary | ICD-10-CM | POA: Diagnosis not present

## 2015-05-27 DIAGNOSIS — N6019 Diffuse cystic mastopathy of unspecified breast: Secondary | ICD-10-CM | POA: Diagnosis not present

## 2015-05-27 DIAGNOSIS — J069 Acute upper respiratory infection, unspecified: Secondary | ICD-10-CM

## 2015-05-27 DIAGNOSIS — G8929 Other chronic pain: Secondary | ICD-10-CM | POA: Diagnosis not present

## 2015-05-27 DIAGNOSIS — I10 Essential (primary) hypertension: Secondary | ICD-10-CM | POA: Diagnosis not present

## 2015-05-27 DIAGNOSIS — Z85820 Personal history of malignant melanoma of skin: Secondary | ICD-10-CM

## 2015-05-27 DIAGNOSIS — F32A Depression, unspecified: Secondary | ICD-10-CM

## 2015-05-27 DIAGNOSIS — Z0000 Encounter for general adult medical examination without abnormal findings: Secondary | ICD-10-CM | POA: Diagnosis not present

## 2015-05-27 DIAGNOSIS — Z5181 Encounter for therapeutic drug level monitoring: Secondary | ICD-10-CM | POA: Diagnosis not present

## 2015-05-27 LAB — POCT URINALYSIS DIPSTICK
BILIRUBIN UA: NEGATIVE
Glucose, UA: NEGATIVE
KETONES UA: NEGATIVE
LEUKOCYTES UA: NEGATIVE
NITRITE UA: NEGATIVE
PH UA: 7
PROTEIN UA: NEGATIVE
Spec Grav, UA: 1.015
Urobilinogen, UA: 0.2

## 2015-05-27 MED ORDER — AZITHROMYCIN 250 MG PO TABS: ORAL_TABLET | ORAL | Status: DC

## 2015-05-27 NOTE — Patient Instructions (Addendum)
Take Z-pak for URI. It was a pleasure to see you today. Continue same medications and follow-up in 6 months

## 2015-07-03 ENCOUNTER — Encounter: Payer: Self-pay | Admitting: Internal Medicine

## 2015-07-03 ENCOUNTER — Telehealth: Payer: Self-pay | Admitting: Internal Medicine

## 2015-07-03 NOTE — Telephone Encounter (Signed)
Patient called complaining of area and pubic and femoral area that was red, warm and irritated. Apparently carbuncle may be forming is well. Sounds like  cellulitis with carbuncle.  She will bathe in Hibiclens for several weeks use and washcloth. Also will apply warm hot compresses 20 minutes  twice daily.  She wanted prescription called in today. Called in to [facility] Bactroban ointment for nostrils daily for 30 days and doxycycline 100 mg twice daily for 15 days. Advised office visit tomorrow. She will consider it.  Interestingly brother's had MRSA and mother had similar presentation a few weeks ago in pubic area.

## 2015-07-04 ENCOUNTER — Encounter: Payer: Self-pay | Admitting: Internal Medicine

## 2015-07-06 ENCOUNTER — Encounter: Payer: Self-pay | Admitting: Internal Medicine

## 2015-08-09 ENCOUNTER — Other Ambulatory Visit: Payer: Self-pay | Admitting: Internal Medicine

## 2015-08-14 ENCOUNTER — Encounter: Payer: Self-pay | Admitting: Internal Medicine

## 2015-08-14 NOTE — Progress Notes (Signed)
Subjective:    Patient ID: Diana House, female    DOB: 1956/02/05, 60 y.o.   MRN: [id number]  HPI 60 year old White female with history of chronic neck pain 60 y.o. for health maintenance exam and evaluation of medical issues. Also has history of hypothyroidism and history of vitamin D deficiency as well as essential hypertension. History of depression, GE reflux, melanoma without recurrence, fibrocystic breast disease. Received flu vaccine through employment. History of elevated liver enzymes evaluated by Dr. Earlean Shawl. These improved after cutting back on alcohol consumption. History of cervical disc disease status post surgery in August 2000. She had cervical disc surgery at C5-C6 and C6-C7. 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 She was referred to pain management clinic. Pression symptoms are stable on medication. GYN is Dr. Nori Riis. Diana House Prilosec for GE reflux. Is on Lexapro and Wellbutrin for depression. His old levothyroxin 0.15 mg daily. Takes HCTZ for hypertension.  Intolerant of Biaxin otherwise no known drug allergies  Patient had a brachial plexus injury 1998, melanoma removed from her back 2005, surgery for strabismus at age 8 and at age 26. Tonsillectomy in the past. Uterine ablation per Dr. Nori Riis in 2005.  Social history: Has adult children, 2 sons and a daughter. One son is an Pension scheme manager in [place]. She does not smoke. Social alcohol consumption.  Family history: Father with history of coronary artery disease, diabetes, CABG and hypertension. 3 brothers and one sister in good health. Mother in good health.  She is on estrogen replacement patch and progesterone per GYN physician    Review of Systems new complaint today is recent URI without fever or chills.     Objective:   Physical Exam  Constitutional: She is oriented to person, place, and time. She appears well-developed and well-nourished.  No distress.  HENT:  Head: Normocephalic and atraumatic.  Right Ear: External ear normal.  Left Ear: External ear normal.  Mouth/Throat: Oropharynx is clear and moist.  Eyes: Conjunctivae and EOM are normal. Pupils are equal, round, and reactive to light. Right eye exhibits no discharge. Left eye exhibits no discharge. No scleral icterus.  Neck: Neck supple. No JVD present. No thyromegaly present.  Cardiovascular: Normal rate, regular rhythm and intact distal pulses.   No murmur heard. Pulmonary/Chest: Effort normal. She has no wheezes. She has no rales.  Breasts normal female without masses  Abdominal: Soft. Bowel sounds are normal. She exhibits no distension and no mass. There is no tenderness. There is no rebound and no guarding.  Genitourinary:  Deferred to GYN  Musculoskeletal: She exhibits no edema.  Neurological: She is alert and oriented to person, place, and time. She has normal reflexes. No cranial nerve deficit. Coordination normal.  Skin: Skin is warm and dry. No rash noted. She is not diaphoretic.  Psychiatric: She has a normal mood and affect. Her behavior is normal. Judgment and thought content normal.  Vitals reviewed.        Assessment & Plan:  Essential hypertension-stable on diuretic  Acute URI-treat with Zithromax Z-PAK  Depression-stable on Wellbutrin and Lexapro  Hypothyroidism-TSH within normal limits on current dose of levothyroxine  History of cervical disc disease status post 2 level surgery    Chronic neck pain related to cervical disc disease and work as a Marine scientist doing heavy lifting. Her first chronic narcotic pain medication 3 pain management  History of vitamin D deficiency-these take vitamin D  3 2000 units daily over-the-counter. Level remains low.  History of melanoma 2005  GE reflux-treated with Prilosec  History of elevated liver enzymes-resolved after cutting back on alcohol consumption. Liver functions with this exam are within normal  limits  Fibrocystic breast disease  Estrogen replacement  Plan: Continue same medications and return in 6 months or as needed.

## 2015-09-21 IMAGING — US US ABDOMEN COMPLETE
1 series · 14 of 25 positions shown · non-contrast
Comparison: None.

CLINICAL DATA: Elevated liver function tests

EXAM:
ULTRASOUND ABDOMEN COMPLETE

[Series 1: us abdomen complete · 0.24mm/px · 14 of 71 slices shown]
[im 1/71]
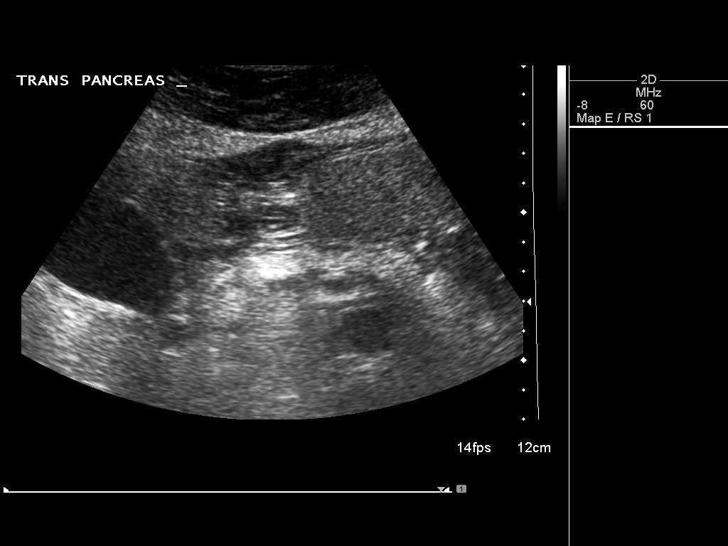
[im 6/71]
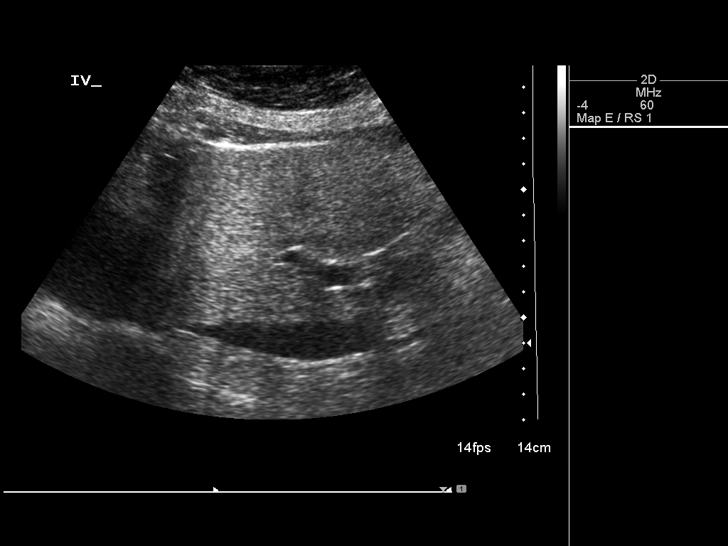
[im 12/71]
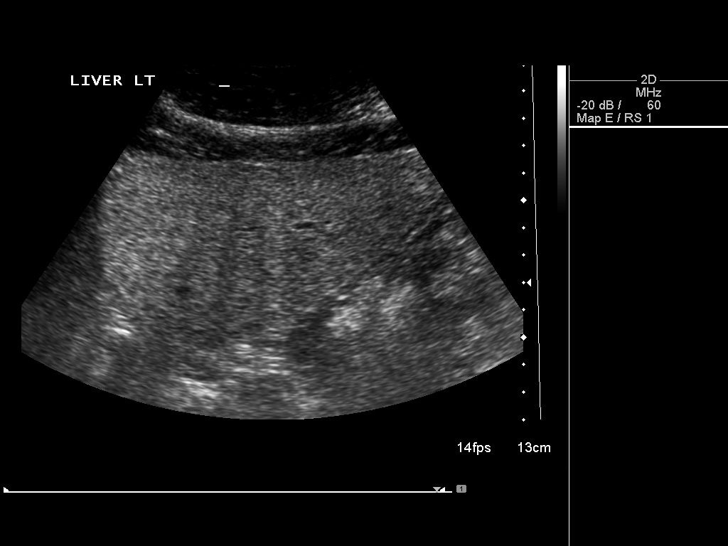
[im 18/71]
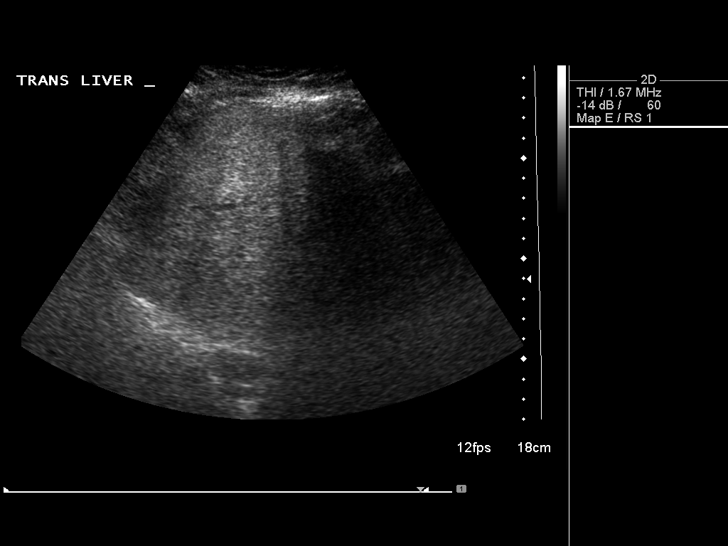
[im 24/71]
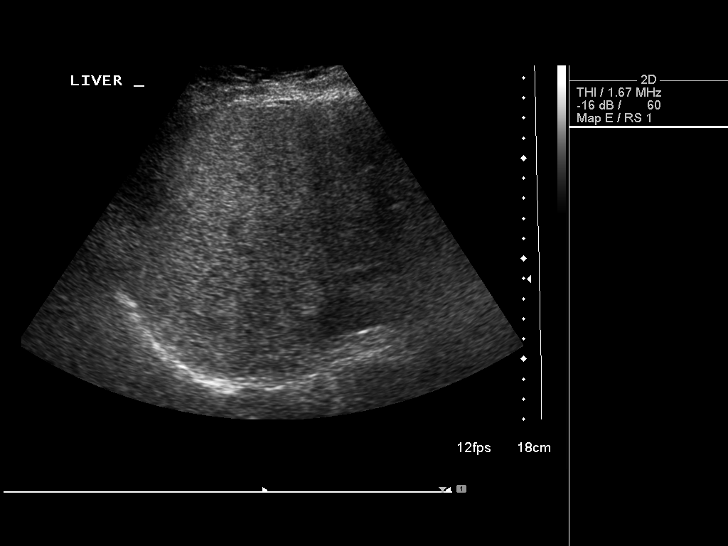
[im 27/71]
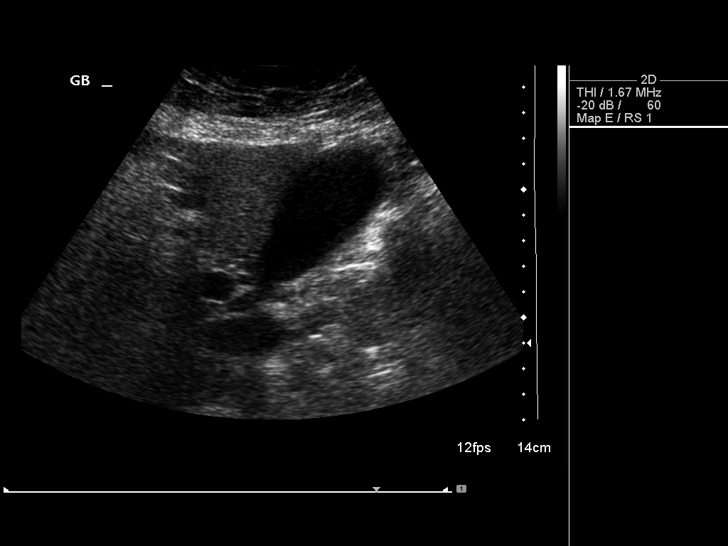
[im 33/71]
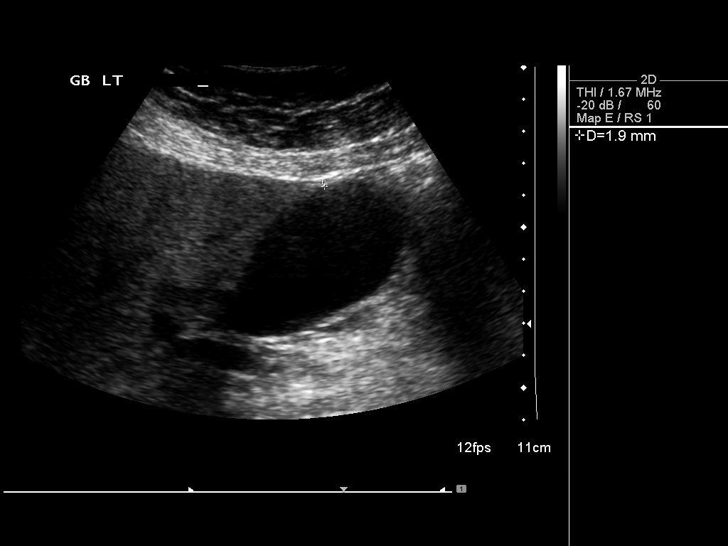
[im 38/71]
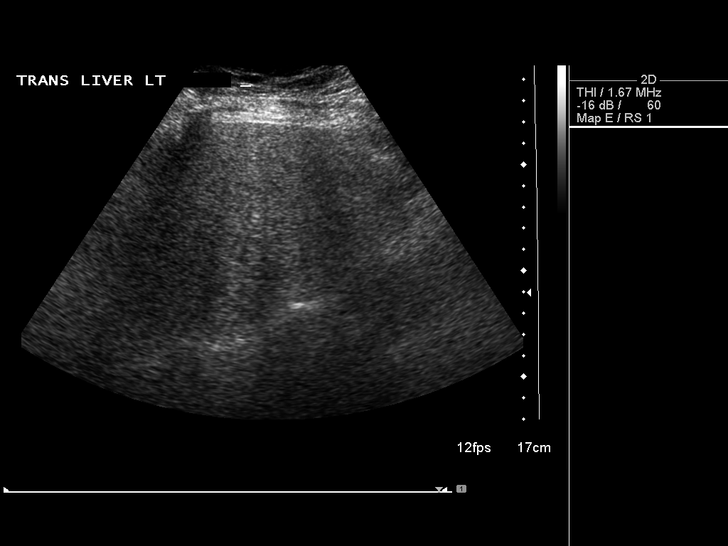
[im 44/71]
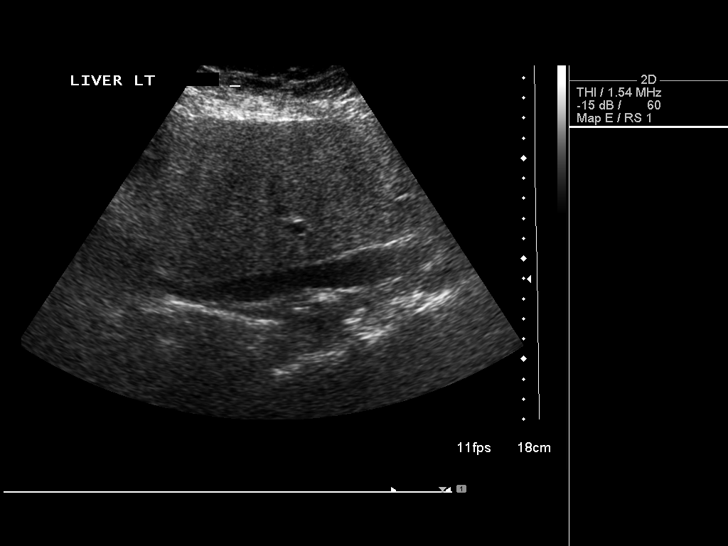
[im 47/71]
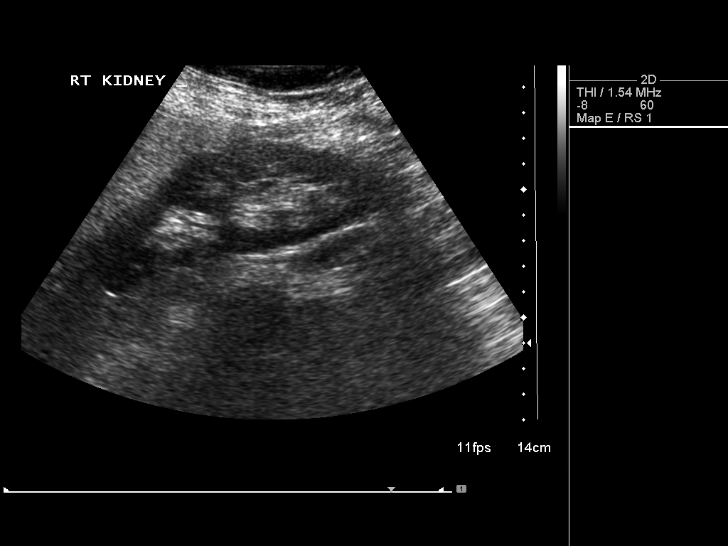
[im 53/71]
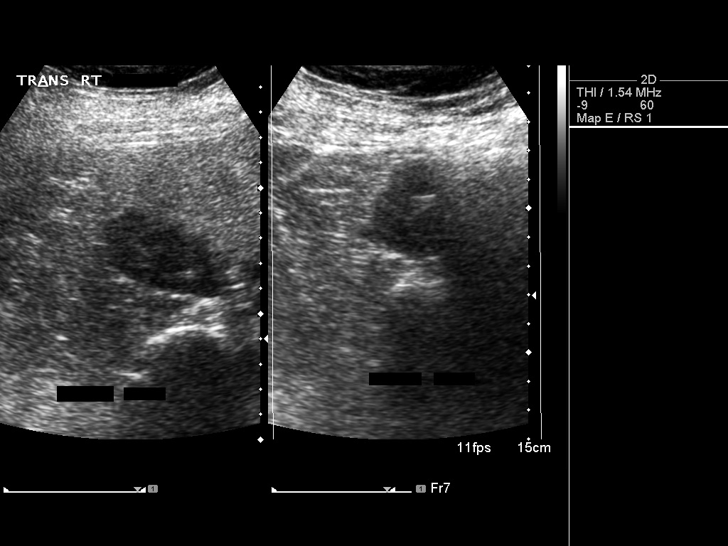
[im 59/71]
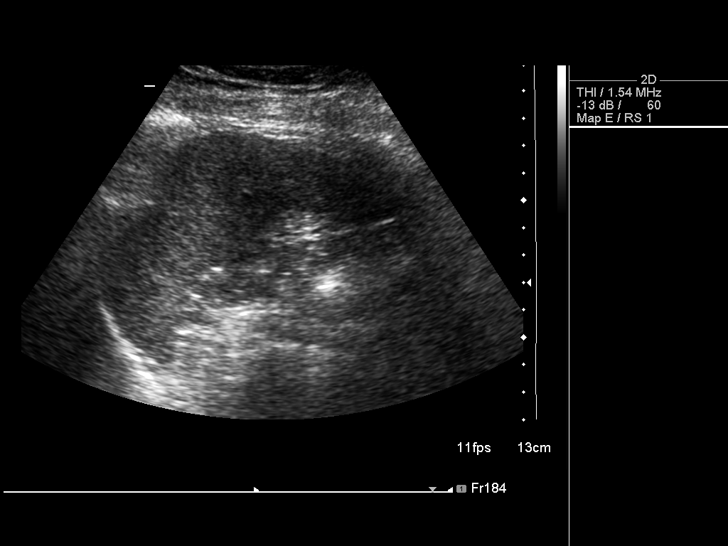
[im 65/71]
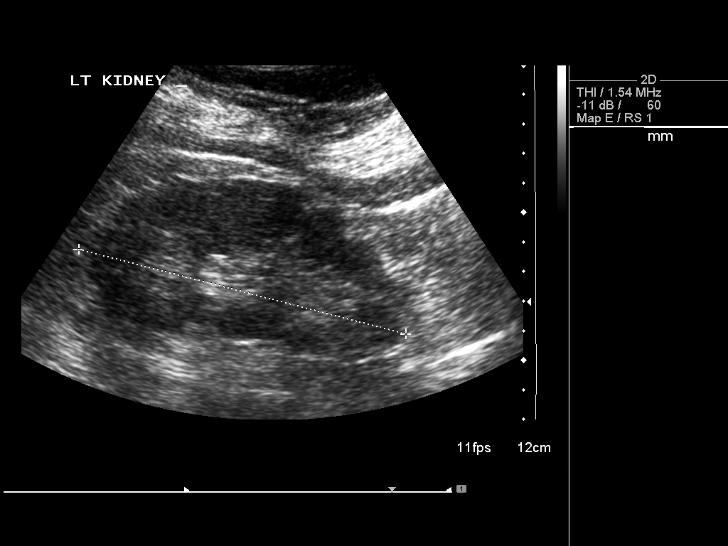
[im 71/71]
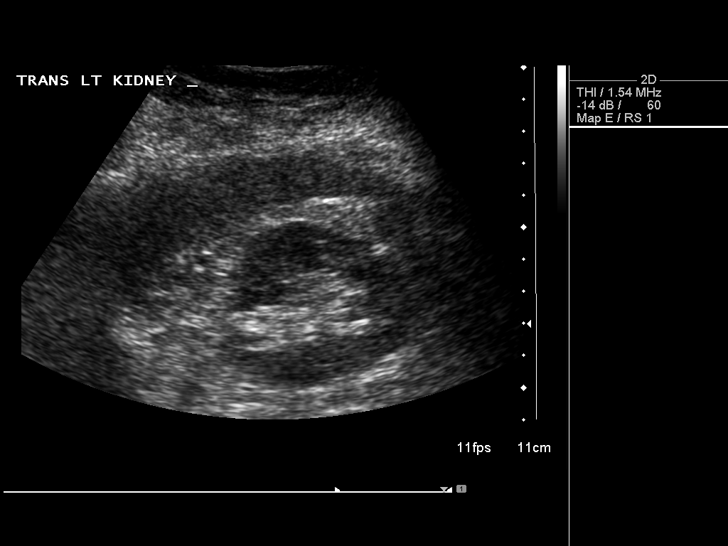

[14 of 25 positions shown; findings below may reference images not displayed]

FINDINGS: Gallbladder

The gallbladder is visualized and no gallstones are noted. There is
no pain over the gallbladder with compression.

Common bile duct

Diameter: The common bile duct is normal measuring 3.3 mm in
diameter.

Liver

The liver is somewhat echogenic consistent with fatty infiltration.
No focal hepatic abnormality is seen.

IVC

No abnormality is visualized.

Pancreas

The head of the pancreas is obscured by overlying bowel gas.

Spleen

The spleen is normal measuring 8.2 cm sagittally.

Right Kidney

Length: The right kidney measures 12.3 cm.. No hydronephrosis is
seen.

Left Kidney

Length: The left kidney measures 11.4 cm.. No hydronephrosis is
noted.

Abdominal aorta

The abdominal aorta is normal caliber.
IMPRESSION: 1. Echogenic liver parenchyma consistent with fatty infiltration.
2. No gallstones. .
3. The head of the pancreas is obscured by bowel gas.

## 2015-10-31 ENCOUNTER — Encounter: Payer: Self-pay | Admitting: Internal Medicine

## 2015-11-01 MED ORDER — METOPROLOL SUCCINATE ER 25 MG PO TB24: ORAL_TABLET | ORAL | Status: DC

## 2015-11-02 ENCOUNTER — Encounter: Payer: Self-pay | Admitting: Internal Medicine

## 2015-11-06 ENCOUNTER — Other Ambulatory Visit: Payer: Self-pay | Admitting: Internal Medicine

## 2015-11-07 ENCOUNTER — Other Ambulatory Visit: Payer: Self-pay | Admitting: Internal Medicine

## 2015-11-13 ENCOUNTER — Other Ambulatory Visit: Payer: Self-pay | Admitting: Internal Medicine

## 2015-11-18 ENCOUNTER — Other Ambulatory Visit: Payer: Self-pay | Admitting: Internal Medicine

## 2015-11-18 NOTE — Telephone Encounter (Signed)
Please call her. Time to check thyroid and see her.

## 2015-11-21 NOTE — Telephone Encounter (Signed)
Patient does not need levothyroxine

## 2015-12-01 ENCOUNTER — Other Ambulatory Visit: Payer: 59 | Admitting: Internal Medicine

## 2015-12-01 DIAGNOSIS — E038 Other specified hypothyroidism: Secondary | ICD-10-CM

## 2015-12-01 LAB — TSH: TSH: 2.1 mIU/L

## 2015-12-05 ENCOUNTER — Encounter: Payer: Self-pay | Admitting: Internal Medicine

## 2015-12-05 ENCOUNTER — Other Ambulatory Visit: Payer: Self-pay | Admitting: Internal Medicine

## 2015-12-05 ENCOUNTER — Ambulatory Visit (INDEPENDENT_AMBULATORY_CARE_PROVIDER_SITE_OTHER): Payer: 59 | Admitting: Internal Medicine

## 2015-12-05 VITALS — BP 144/84 | HR 78 | Temp 97.6°F | Resp 18 | Ht 65.0 in | Wt 182.5 lb

## 2015-12-05 DIAGNOSIS — F32A Depression, unspecified: Secondary | ICD-10-CM

## 2015-12-05 DIAGNOSIS — F329 Major depressive disorder, single episode, unspecified: Secondary | ICD-10-CM

## 2015-12-05 DIAGNOSIS — R002 Palpitations: Secondary | ICD-10-CM | POA: Diagnosis not present

## 2015-12-05 DIAGNOSIS — I10 Essential (primary) hypertension: Secondary | ICD-10-CM

## 2015-12-05 DIAGNOSIS — E039 Hypothyroidism, unspecified: Secondary | ICD-10-CM | POA: Diagnosis not present

## 2015-12-05 MED ORDER — METOPROLOL SUCCINATE ER 25 MG PO TB24: 25.0000 mg | ORAL_TABLET | Freq: Every day | ORAL | Status: DC

## 2015-12-05 MED ORDER — LEVOTHYROXINE SODIUM 125 MCG PO TABS: ORAL_TABLET | ORAL | Status: DC

## 2015-12-05 NOTE — Progress Notes (Signed)
   Subjective:    Patient ID: Diana House, female    DOB: [birth year], 60 y.o.   MRN: [id number]  HPI 60 year old Female in today for follow-up on hypertension, palpitations, depression, hypothyroidism. She generally doesn't take metoprolol every day. Just takes it to control palpitations. Blood pressure was elevated initially when she came into the office at 144/84. Later rechecked was 130/70. Pulse is normal at 78. Weight is increased from 174.5 pounds to 182.5 pounds. She doesn't exercise very much. 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    Review of Systems see above     Objective:   Physical Exam  Skin warm and dry. Nodes none. Neck is supple without JVD thyromegaly carotid bruits. Chest clear. Cardiac exam regular rate and rhythm normal S1 and S2. Extremities without edema.      Assessment & Plan:  History of palpitations-treated with when necessary metoprolol  Essential hypertension-begin metoprolol 25 mg daily. Call with blood pressure checks in 2 weeks.  Hypothyroidism-stable on current dose of thyroid replacement  Depression-stable on current medication  Chronic neck pain-seen by pain management physician  Plan: Continue same medications. Take metoprolol 25 mg daily instead of when necessary. Call with blood pressure checks in a couple of weeks. Return in 6 months for physical exam. Continue same dose of thyroid replacement.

## 2015-12-05 NOTE — Patient Instructions (Signed)
Take metoprolol 25 mg daily. Call with blood pressure checks in a couple of weeks. Otherwise return in 6 months for physical examination. It was a pleasure to see you today.

## 2016-04-21 ENCOUNTER — Other Ambulatory Visit: Payer: Self-pay | Admitting: Internal Medicine

## 2016-04-26 ENCOUNTER — Other Ambulatory Visit: Payer: Self-pay | Admitting: Internal Medicine

## 2016-05-24 ENCOUNTER — Other Ambulatory Visit: Payer: Self-pay | Admitting: Internal Medicine

## 2016-05-24 ENCOUNTER — Other Ambulatory Visit: Payer: 59 | Admitting: Internal Medicine

## 2016-05-24 DIAGNOSIS — F329 Major depressive disorder, single episode, unspecified: Secondary | ICD-10-CM

## 2016-05-24 DIAGNOSIS — Z0000 Encounter for general adult medical examination without abnormal findings: Secondary | ICD-10-CM

## 2016-05-24 DIAGNOSIS — E039 Hypothyroidism, unspecified: Secondary | ICD-10-CM

## 2016-05-24 DIAGNOSIS — Z85820 Personal history of malignant melanoma of skin: Secondary | ICD-10-CM

## 2016-05-24 DIAGNOSIS — F32A Depression, unspecified: Secondary | ICD-10-CM

## 2016-05-24 DIAGNOSIS — I10 Essential (primary) hypertension: Secondary | ICD-10-CM

## 2016-05-24 LAB — CBC WITH DIFFERENTIAL/PLATELET
BASOS ABS: 58 {cells}/uL (ref 0–200)
BASOS PCT: 1 %
EOS ABS: 406 {cells}/uL (ref 15–500)
Eosinophils Relative: 7 %
HEMATOCRIT: 37 % (ref 35.0–45.0)
Hemoglobin: 12.2 g/dL (ref 11.7–15.5)
LYMPHS PCT: 35 %
Lymphs Abs: 2030 cells/uL (ref 850–3900)
MCH: 33.5 pg — AB (ref 27.0–33.0)
MCHC: 33 g/dL (ref 32.0–36.0)
MCV: 101.6 fL — AB (ref 80.0–100.0)
MONO ABS: 348 {cells}/uL (ref 200–950)
MONOS PCT: 6 %
MPV: 11 fL (ref 7.5–12.5)
NEUTROS PCT: 51 %
Neutro Abs: 2958 cells/uL (ref 1500–7800)
PLATELETS: 263 10*3/uL (ref 140–400)
RBC: 3.64 MIL/uL — ABNORMAL LOW (ref 3.80–5.10)
RDW: 12.7 % (ref 11.0–15.0)
WBC: 5.8 10*3/uL (ref 3.8–10.8)

## 2016-05-24 LAB — LIPID PANEL
Cholesterol: 177 mg/dL (ref <200)
HDL: 53 mg/dL (ref >50)
LDL CALC: 101 mg/dL — AB
TRIGLYCERIDES: 116 mg/dL (ref <150)
Total CHOL/HDL Ratio: 3.3 Ratio (ref <5.0)
VLDL: 23 mg/dL (ref <30)

## 2016-05-24 LAB — COMPLETE METABOLIC PANEL WITH GFR
ALBUMIN: 3.9 g/dL (ref 3.6–5.1)
ALK PHOS: 70 U/L (ref 33–130)
ALT: 29 U/L (ref 6–29)
AST: 25 U/L (ref 10–35)
BUN: 10 mg/dL (ref 7–25)
CALCIUM: 8.9 mg/dL (ref 8.6–10.4)
CHLORIDE: 102 mmol/L (ref 98–110)
CO2: 26 mmol/L (ref 20–31)
Creat: 0.77 mg/dL (ref 0.50–1.05)
GFR, EST NON AFRICAN AMERICAN: 85 mL/min (ref >=60)
GFR, Est African American: >89 mL/min (ref >=60)
Glucose, Bld: 97 mg/dL (ref 65–99)
POTASSIUM: 4.1 mmol/L (ref 3.5–5.3)
Sodium: 138 mmol/L (ref 135–146)
Total Bilirubin: 0.5 mg/dL (ref 0.2–1.2)
Total Protein: 6.5 g/dL (ref 6.1–8.1)

## 2016-05-24 LAB — TSH: TSH: 5.31 mIU/L — ABNORMAL HIGH

## 2016-05-24 NOTE — Addendum Note (Signed)
Addended by: Townsend Roger D on: 05/24/2016 10:45 AM   Modules accepted: Orders

## 2016-05-25 LAB — B12 AND FOLATE PANEL
FOLATE: 12.1 ng/mL (ref >5.4)
Vitamin B-12: 459 pg/mL (ref 200–1100)

## 2016-05-25 LAB — VITAMIN D 25 HYDROXY (VIT D DEFICIENCY, FRACTURES): Vit D, 25-Hydroxy: 29 ng/mL — ABNORMAL LOW (ref 30–100)

## 2016-05-28 ENCOUNTER — Encounter: Payer: Self-pay | Admitting: Internal Medicine

## 2016-05-28 ENCOUNTER — Ambulatory Visit (INDEPENDENT_AMBULATORY_CARE_PROVIDER_SITE_OTHER): Payer: 59 | Admitting: Internal Medicine

## 2016-05-28 VITALS — BP 128/80 | HR 66 | Temp 97.6°F | Ht 65.0 in | Wt 186.0 lb

## 2016-05-28 DIAGNOSIS — Z0000 Encounter for general adult medical examination without abnormal findings: Secondary | ICD-10-CM

## 2016-05-28 DIAGNOSIS — I10 Essential (primary) hypertension: Secondary | ICD-10-CM

## 2016-05-28 DIAGNOSIS — M542 Cervicalgia: Secondary | ICD-10-CM

## 2016-05-28 DIAGNOSIS — K219 Gastro-esophageal reflux disease without esophagitis: Secondary | ICD-10-CM | POA: Diagnosis not present

## 2016-05-28 DIAGNOSIS — G8929 Other chronic pain: Secondary | ICD-10-CM | POA: Diagnosis not present

## 2016-05-28 DIAGNOSIS — F3289 Other specified depressive episodes: Secondary | ICD-10-CM | POA: Diagnosis not present

## 2016-05-28 DIAGNOSIS — Z85820 Personal history of malignant melanoma of skin: Secondary | ICD-10-CM | POA: Diagnosis not present

## 2016-05-28 DIAGNOSIS — E559 Vitamin D deficiency, unspecified: Secondary | ICD-10-CM | POA: Diagnosis not present

## 2016-05-28 DIAGNOSIS — N6019 Diffuse cystic mastopathy of unspecified breast: Secondary | ICD-10-CM | POA: Diagnosis not present

## 2016-05-28 LAB — POCT URINALYSIS DIPSTICK
BILIRUBIN UA: NEGATIVE
GLUCOSE UA: NEGATIVE
Ketones, UA: NEGATIVE
LEUKOCYTES UA: NEGATIVE
NITRITE UA: NEGATIVE
Protein, UA: NEGATIVE
RBC UA: NEGATIVE
Spec Grav, UA: 1.015
Urobilinogen, UA: NEGATIVE
pH, UA: 5

## 2016-05-28 MED ORDER — LEVOTHYROXINE SODIUM 150 MCG PO TABS: 150.0000 ug | ORAL_TABLET | Freq: Every day | ORAL | 0 refills | Status: DC

## 2016-05-28 NOTE — Progress Notes (Signed)
Subjective:    Patient ID: Diana House, female    DOB: 10/05/1955, 60 y.o.   MRN: [id number]  HPI 60 year old female in today for health maintenance exam. In April 2016, TSH was 0.111. She was having some issues with palpitations and we changed her dose of Synthroid from 0.15 mg daily to 0.125 mg daily. Since then, patient has gained weight. Recent TSH is elevated at 5.81. Denies noncompliance with medication. Taking it on empty stomach and not with other medications.  We are going to adjust dose of thyroid replacement medication back to 0.15 mg daily with follow-up in 6 weeks.  She has a history of vitamin D deficiency, essential hypertension, depression, GE reflux, melanoma without recurrence, fibrocystic breast disease.  Received flu vaccine through employment.  History of elevated liver enzymes evaluated by Dr. Earlean Shawl. These improved after cutting back on alcohol consumption.  History of cervical disc disease status post surgery August 2000. She had cervical this surgery at C5-C6 and C6-C7. With her job as a Marine scientist in the emergency department, she experiences some chronic neck pain and is on chronic narcotic medication. She was referred to pain management clinic. Depression symptoms are stable on medication.  GYN is Dr. Nori Riis. She takes Prilosec for GE reflux. She is on Lexapro and Wellbutrin for depression. She takes HCTZ for hypertension.  Intolerant of Biaxin otherwise no known drug allergies.  Patient had a brachial plexus injury 1998, melanoma removed from her back 2005, surgery for strabismus at age 51 and at age 9. Tonsillectomy in the past. Uterine ablation per Dr. Nori Riis in 2005.  Social history: She has adult children, 2 sons and a daughter. One son is an Pension scheme manager. She does not smoke. Social alcohol consumption.  Family history: Father with history of coronary artery disease diabetes CABG and hypertension. 3 brothers and one sister in good health. Mother in good health.  Patient  is on estrogen replacement patch and progesterone per GYN.    Review of Systems main complaint is weight gain. Chronic neck pain treated through chronic pain management physician.     Objective:   Physical Exam  Constitutional: She is oriented to person, place, and time. She appears well-developed and well-nourished. No distress.  HENT:  Head: Normocephalic and atraumatic.  Right Ear: External ear normal.  Left Ear: External ear normal.  Mouth/Throat: Oropharynx is clear and moist.  Eyes: Conjunctivae and EOM are normal. Pupils are equal, round, and reactive to light.  Neck: Neck supple. No JVD present. No thyromegaly present.  Cardiovascular: Normal rate, normal heart sounds and intact distal pulses.   No murmur heard. Pulmonary/Chest: Effort normal and breath sounds normal. No respiratory distress. She has no wheezes. She has no rales.  Breasts normal female  Abdominal: Soft. Bowel sounds are normal. She exhibits no distension and no mass. There is no tenderness. There is no rebound and no guarding.  Genitourinary:  Genitourinary Comments: Deferred to Dr. Nori Riis  Musculoskeletal: She exhibits no edema or deformity.  Lymphadenopathy:    She has no cervical adenopathy.  Neurological: She is alert and oriented to person, place, and time. She has normal reflexes. No cranial nerve deficit. Coordination normal.  Skin: Skin is warm and dry. No rash noted. She is not diaphoretic.  No suspicious lesions  Psychiatric: She has a normal mood and affect. Her behavior is normal. Judgment and thought content normal.  Vitals reviewed.         Assessment & Plan:  Hypothyroidism-increase levothyroxine to 0.15  mg daily and follow-up in 6 weeks  Vitamin D deficiency-take 4000 units at least 3 times weekly in 2000 units every other day. Level is low at 29.  Chronic neck pain-treated by pain management physician  Depression-stable on current treatment with 2 drug regimen  History of melanoma  2005  GE reflux  History of elevated liver enzymes resolved after cutting back on alcohol consumption.  Fibrocystic breast disease  Estrogen replacement.  Plan: Increase levothyroxine to 0.15 mg daily and follow-up in 6 weeks.

## 2016-06-13 NOTE — Patient Instructions (Addendum)
Was pleasure to see you today. Increased dose of levothyroxine and return in 6 weeks. Continue other medications as previously prescribed. Take vitamin D supplementation.

## 2016-06-21 ENCOUNTER — Telehealth: Payer: Self-pay | Admitting: Internal Medicine

## 2016-06-21 NOTE — Telephone Encounter (Signed)
Spoke with patient and provided appointment for Friday, 06/22/16 @ 10:00 a.m.    Patient confirmed.

## 2016-06-21 NOTE — Telephone Encounter (Signed)
Patient is calling requesting a Rx for sciatica.  States that she called the Pain Clinic, but they wanted to see her next week.  And, she is needing something this week for relief.  She is wanting to know if you will call her in some Prednisone for relief.    Pharmacy:  [facility] in [place].  Best Number for contact:  [phone]

## 2016-06-21 NOTE — Telephone Encounter (Signed)
This requires an office visit

## 2016-06-22 ENCOUNTER — Ambulatory Visit: Payer: 59 | Admitting: Internal Medicine

## 2016-07-10 ENCOUNTER — Other Ambulatory Visit: Payer: 59 | Admitting: Internal Medicine

## 2016-07-10 DIAGNOSIS — E038 Other specified hypothyroidism: Secondary | ICD-10-CM

## 2016-07-10 LAB — TSH: TSH: 1.28 mIU/L

## 2016-07-11 ENCOUNTER — Other Ambulatory Visit: Payer: Self-pay | Admitting: Internal Medicine

## 2016-07-12 ENCOUNTER — Ambulatory Visit (INDEPENDENT_AMBULATORY_CARE_PROVIDER_SITE_OTHER): Payer: 59 | Admitting: Internal Medicine

## 2016-07-12 ENCOUNTER — Encounter: Payer: Self-pay | Admitting: Internal Medicine

## 2016-07-12 VITALS — BP 138/70 | HR 62 | Temp 98.3°F | Ht 65.0 in | Wt 190.0 lb

## 2016-07-12 DIAGNOSIS — E039 Hypothyroidism, unspecified: Secondary | ICD-10-CM | POA: Diagnosis not present

## 2016-07-12 DIAGNOSIS — M5417 Radiculopathy, lumbosacral region: Secondary | ICD-10-CM

## 2016-07-12 DIAGNOSIS — G8929 Other chronic pain: Secondary | ICD-10-CM | POA: Diagnosis not present

## 2016-07-12 DIAGNOSIS — M5431 Sciatica, right side: Secondary | ICD-10-CM

## 2016-07-12 DIAGNOSIS — M542 Cervicalgia: Secondary | ICD-10-CM

## 2016-07-12 DIAGNOSIS — M5416 Radiculopathy, lumbar region: Secondary | ICD-10-CM

## 2016-07-12 MED ORDER — LEVOTHYROXINE SODIUM 150 MCG PO TABS: 150.0000 ug | ORAL_TABLET | Freq: Every day | ORAL | 1 refills | Status: DC

## 2016-07-12 NOTE — Patient Instructions (Signed)
Continue current dose of thyroid replacement. Return November 2018. Order written for physical therapy for right sciatica with radiculopathy

## 2016-07-12 NOTE — Progress Notes (Signed)
   Subjective:    Patient ID: Diana House, female    DOB: August 18, 1955, 60 y.o.   MRN: [id number]  HPI She is here to follow-up on hypothyroidism. In November TSH was elevated at 5.31 on thyroid replacement. She at that time was on 0.125 mg daily and I increased it to 0.15 mg daily and now TSH is within normal limits. She feels okay from that standpoint  However, she's been out of work for 3 weeks with right-sided sciatica. It is going down her right leg and says her right foot is a bit numb. She's been seen at pain management. She is waiting for an MRI to get approved and it's been a couple of weeks. She tried some prednisone that her husband had but it really did not help the symptoms at all. She works as an Public librarian.  She has a history of cervical disc disease. Has seen Dr. Vertell Limber in the past. Neck pain is treated by pain management clinic.    Review of Systems see above     Objective:   Physical Exam TSH is stable on current dose of thyroid replacement 0.15 mg daily. This will be filled to opt him Rx. Her physical exam will be due in 06/04/1717.  I have written her an order to have physical therapy at [facility] regarding right-sided sciatica. She will follow up with pain management regarding her MRI being approved       Assessment & Plan:  Hypothyroidism  History of depression  Right-sided sciatica with radiculopathy  Plan: Agree with MRI. Continue thyroid replacement 0.15 mg daily. Follow-up November for physical examination.  Pt for sciatica will be ordered.

## 2016-07-13 ENCOUNTER — Telehealth: Payer: Self-pay | Admitting: Internal Medicine

## 2016-07-13 DIAGNOSIS — M5430 Sciatica, unspecified side: Secondary | ICD-10-CM

## 2016-07-13 NOTE — Telephone Encounter (Signed)
Raquel Sarna from [facility] called requesting an order PT and recent OV notes pertaining to the patient. Fax # [phone]. Please advise.

## 2016-07-13 NOTE — Telephone Encounter (Signed)
Order for PT Note has been dictated.

## 2016-07-13 NOTE — Telephone Encounter (Signed)
Pt order and notes faxed.

## 2016-07-18 ENCOUNTER — Telehealth: Payer: Self-pay | Admitting: Internal Medicine

## 2016-07-18 DIAGNOSIS — M5090 Cervical disc disorder, unspecified, unspecified cervical region: Secondary | ICD-10-CM

## 2016-07-18 DIAGNOSIS — M5416 Radiculopathy, lumbar region: Secondary | ICD-10-CM

## 2016-07-18 DIAGNOSIS — R200 Anesthesia of skin: Secondary | ICD-10-CM

## 2016-07-18 NOTE — Telephone Encounter (Signed)
MRI [id number] ordered.

## 2016-07-18 NOTE — Telephone Encounter (Signed)
Please clarify type of MRI to order?

## 2016-07-18 NOTE — Telephone Encounter (Signed)
See if you can get approved based on my note.

## 2016-07-18 NOTE — Telephone Encounter (Signed)
She has a right lumbar radiculopathy with a numb right foot. She needs an MRI of the LS spine without contrast.

## 2016-07-18 NOTE — Telephone Encounter (Signed)
Patient called stating that the pain management aren't cooperating with her and won't get her scheduled for an MRI and she was wondering if Dr. Renold Genta would place the order. Please advise

## 2016-07-20 NOTE — Telephone Encounter (Signed)
MRI approved via Dr Greta Doom.  Patient has appointment scheduled.

## 2016-07-24 ENCOUNTER — Ambulatory Visit
Admission: RE | Admit: 2016-07-24 | Discharge: 2016-07-24 | Disposition: A | Discharge status: Home / Self Care | Payer: 59 | Source: Ambulatory Visit | Attending: Internal Medicine | Admitting: Internal Medicine

## 2016-07-24 DIAGNOSIS — M5090 Cervical disc disorder, unspecified, unspecified cervical region: Secondary | ICD-10-CM

## 2016-07-24 DIAGNOSIS — R200 Anesthesia of skin: Secondary | ICD-10-CM

## 2016-07-24 DIAGNOSIS — G894 Chronic pain syndrome: Secondary | ICD-10-CM | POA: Diagnosis not present

## 2016-07-24 DIAGNOSIS — M47812 Spondylosis without myelopathy or radiculopathy, cervical region: Secondary | ICD-10-CM | POA: Diagnosis not present

## 2016-07-24 DIAGNOSIS — M48061 Spinal stenosis, lumbar region without neurogenic claudication: Secondary | ICD-10-CM | POA: Diagnosis not present

## 2016-07-24 DIAGNOSIS — G5622 Lesion of ulnar nerve, left upper limb: Secondary | ICD-10-CM | POA: Diagnosis not present

## 2016-07-24 DIAGNOSIS — M5416 Radiculopathy, lumbar region: Secondary | ICD-10-CM

## 2016-07-24 DIAGNOSIS — M47817 Spondylosis without myelopathy or radiculopathy, lumbosacral region: Secondary | ICD-10-CM | POA: Diagnosis not present

## 2016-08-08 DIAGNOSIS — I10 Essential (primary) hypertension: Secondary | ICD-10-CM | POA: Diagnosis not present

## 2016-08-08 DIAGNOSIS — M48061 Spinal stenosis, lumbar region without neurogenic claudication: Secondary | ICD-10-CM | POA: Diagnosis not present

## 2016-08-10 DIAGNOSIS — M5117 Intervertebral disc disorders with radiculopathy, lumbosacral region: Secondary | ICD-10-CM | POA: Diagnosis not present

## 2016-08-10 DIAGNOSIS — M5127 Other intervertebral disc displacement, lumbosacral region: Secondary | ICD-10-CM | POA: Diagnosis not present

## 2016-08-10 DIAGNOSIS — M4727 Other spondylosis with radiculopathy, lumbosacral region: Secondary | ICD-10-CM | POA: Diagnosis not present

## 2016-08-10 DIAGNOSIS — M5116 Intervertebral disc disorders with radiculopathy, lumbar region: Secondary | ICD-10-CM | POA: Diagnosis not present

## 2016-08-16 HISTORY — PX: LUMBAR SPINE SURGERY: SHX701

## 2016-08-22 DIAGNOSIS — D2262 Melanocytic nevi of left upper limb, including shoulder: Secondary | ICD-10-CM | POA: Diagnosis not present

## 2016-08-22 DIAGNOSIS — Z85828 Personal history of other malignant neoplasm of skin: Secondary | ICD-10-CM | POA: Diagnosis not present

## 2016-08-22 DIAGNOSIS — L821 Other seborrheic keratosis: Secondary | ICD-10-CM | POA: Diagnosis not present

## 2016-09-04 DIAGNOSIS — H524 Presbyopia: Secondary | ICD-10-CM | POA: Diagnosis not present

## 2016-09-04 DIAGNOSIS — H5203 Hypermetropia, bilateral: Secondary | ICD-10-CM | POA: Diagnosis not present

## 2016-09-04 DIAGNOSIS — H52223 Regular astigmatism, bilateral: Secondary | ICD-10-CM | POA: Diagnosis not present

## 2016-09-18 DIAGNOSIS — G5622 Lesion of ulnar nerve, left upper limb: Secondary | ICD-10-CM | POA: Diagnosis not present

## 2016-09-18 DIAGNOSIS — M47812 Spondylosis without myelopathy or radiculopathy, cervical region: Secondary | ICD-10-CM | POA: Diagnosis not present

## 2016-09-18 DIAGNOSIS — G894 Chronic pain syndrome: Secondary | ICD-10-CM | POA: Diagnosis not present

## 2016-10-10 ENCOUNTER — Other Ambulatory Visit: Payer: Self-pay | Admitting: Internal Medicine

## 2016-10-18 DIAGNOSIS — Z01419 Encounter for gynecological examination (general) (routine) without abnormal findings: Secondary | ICD-10-CM | POA: Diagnosis not present

## 2016-11-12 ENCOUNTER — Other Ambulatory Visit: Payer: Self-pay | Admitting: Internal Medicine

## 2016-11-13 DIAGNOSIS — M47812 Spondylosis without myelopathy or radiculopathy, cervical region: Secondary | ICD-10-CM | POA: Diagnosis not present

## 2016-11-13 DIAGNOSIS — G894 Chronic pain syndrome: Secondary | ICD-10-CM | POA: Diagnosis not present

## 2016-11-13 DIAGNOSIS — G5622 Lesion of ulnar nerve, left upper limb: Secondary | ICD-10-CM | POA: Diagnosis not present

## 2016-11-13 DIAGNOSIS — Z79891 Long term (current) use of opiate analgesic: Secondary | ICD-10-CM | POA: Diagnosis not present

## 2016-11-14 DIAGNOSIS — Z79891 Long term (current) use of opiate analgesic: Secondary | ICD-10-CM | POA: Diagnosis not present

## 2016-12-23 ENCOUNTER — Other Ambulatory Visit: Payer: Self-pay | Admitting: Internal Medicine

## 2016-12-25 ENCOUNTER — Other Ambulatory Visit: Payer: Self-pay | Admitting: Internal Medicine

## 2017-01-14 DIAGNOSIS — G894 Chronic pain syndrome: Secondary | ICD-10-CM | POA: Diagnosis not present

## 2017-01-14 DIAGNOSIS — G5622 Lesion of ulnar nerve, left upper limb: Secondary | ICD-10-CM | POA: Diagnosis not present

## 2017-01-14 DIAGNOSIS — M47812 Spondylosis without myelopathy or radiculopathy, cervical region: Secondary | ICD-10-CM | POA: Diagnosis not present

## 2017-02-13 DIAGNOSIS — Z01812 Encounter for preprocedural laboratory examination: Secondary | ICD-10-CM | POA: Diagnosis not present

## 2017-02-19 DIAGNOSIS — L0501 Pilonidal cyst with abscess: Secondary | ICD-10-CM | POA: Diagnosis not present

## 2017-02-19 DIAGNOSIS — Z882 Allergy status to sulfonamides status: Secondary | ICD-10-CM | POA: Diagnosis not present

## 2017-02-19 DIAGNOSIS — Z87891 Personal history of nicotine dependence: Secondary | ICD-10-CM | POA: Diagnosis not present

## 2017-03-12 DIAGNOSIS — G894 Chronic pain syndrome: Secondary | ICD-10-CM | POA: Diagnosis not present

## 2017-03-12 DIAGNOSIS — M47812 Spondylosis without myelopathy or radiculopathy, cervical region: Secondary | ICD-10-CM | POA: Diagnosis not present

## 2017-03-12 DIAGNOSIS — G5622 Lesion of ulnar nerve, left upper limb: Secondary | ICD-10-CM | POA: Diagnosis not present

## 2017-03-24 ENCOUNTER — Other Ambulatory Visit: Payer: Self-pay | Admitting: Internal Medicine

## 2017-05-13 DIAGNOSIS — G5622 Lesion of ulnar nerve, left upper limb: Secondary | ICD-10-CM | POA: Diagnosis not present

## 2017-05-13 DIAGNOSIS — G894 Chronic pain syndrome: Secondary | ICD-10-CM | POA: Diagnosis not present

## 2017-05-13 DIAGNOSIS — M47812 Spondylosis without myelopathy or radiculopathy, cervical region: Secondary | ICD-10-CM | POA: Diagnosis not present

## 2017-05-23 ENCOUNTER — Encounter: Payer: 59 | Admitting: Internal Medicine

## 2017-05-23 ENCOUNTER — Other Ambulatory Visit: Payer: 59 | Admitting: Internal Medicine

## 2017-05-23 DIAGNOSIS — F3289 Other specified depressive episodes: Secondary | ICD-10-CM

## 2017-05-23 DIAGNOSIS — Z0000 Encounter for general adult medical examination without abnormal findings: Secondary | ICD-10-CM

## 2017-05-23 DIAGNOSIS — K219 Gastro-esophageal reflux disease without esophagitis: Secondary | ICD-10-CM

## 2017-05-23 DIAGNOSIS — E559 Vitamin D deficiency, unspecified: Secondary | ICD-10-CM

## 2017-05-23 DIAGNOSIS — E039 Hypothyroidism, unspecified: Secondary | ICD-10-CM

## 2017-05-23 DIAGNOSIS — Z13220 Encounter for screening for lipoid disorders: Secondary | ICD-10-CM

## 2017-05-23 DIAGNOSIS — I10 Essential (primary) hypertension: Secondary | ICD-10-CM

## 2017-05-24 LAB — CBC WITH DIFFERENTIAL/PLATELET
Basophils Absolute: 61 cells/uL (ref 0–200)
Basophils Relative: 0.9 %
Eosinophils Absolute: 422 cells/uL (ref 15–500)
Eosinophils Relative: 6.2 %
HEMATOCRIT: 36.8 % (ref 35.0–45.0)
Hemoglobin: 12.6 g/dL (ref 11.7–15.5)
LYMPHS ABS: 1802 {cells}/uL (ref 850–3900)
MCH: 34.2 pg — ABNORMAL HIGH (ref 27.0–33.0)
MCHC: 34.2 g/dL (ref 32.0–36.0)
MCV: 100 fL (ref 80.0–100.0)
MPV: 11.3 fL (ref 7.5–12.5)
Monocytes Relative: 6.9 %
NEUTROS PCT: 59.5 %
Neutro Abs: 4046 cells/uL (ref 1500–7800)
Platelets: 272 10*3/uL (ref 140–400)
RBC: 3.68 10*6/uL — AB (ref 3.80–5.10)
RDW: 11.8 % (ref 11.0–15.0)
Total Lymphocyte: 26.5 %
WBC: 6.8 10*3/uL (ref 3.8–10.8)
WBCMIX: 469 {cells}/uL (ref 200–950)

## 2017-05-24 LAB — COMPLETE METABOLIC PANEL WITHOUT GFR
AG Ratio: 1.8 (calc) (ref 1.0–2.5)
ALT: 42 U/L — ABNORMAL HIGH (ref 6–29)
AST: 28 U/L (ref 10–35)
Albumin: 4.2 g/dL (ref 3.6–5.1)
Alkaline phosphatase (APISO): 85 U/L (ref 33–130)
BUN: 11 mg/dL (ref 7–25)
CO2: 32 mmol/L (ref 20–32)
Calcium: 9.4 mg/dL (ref 8.6–10.4)
Chloride: 100 mmol/L (ref 98–110)
Creat: 0.63 mg/dL (ref 0.50–0.99)
GFR, Est African American: 113 mL/min/1.73m2 (ref >=60)
GFR, Est Non African American: 97 mL/min/1.73m2 (ref >=60)
Globulin: 2.4 g/dL (ref 1.9–3.7)
Glucose, Bld: 106 mg/dL — ABNORMAL HIGH (ref 65–99)
Potassium: 4.1 mmol/L (ref 3.5–5.3)
Sodium: 138 mmol/L (ref 135–146)
Total Bilirubin: 0.4 mg/dL (ref 0.2–1.2)
Total Protein: 6.6 g/dL (ref 6.1–8.1)

## 2017-05-24 LAB — VITAMIN D 25 HYDROXY (VIT D DEFICIENCY, FRACTURES): VIT D 25 HYDROXY: 38 ng/mL (ref 30–100)

## 2017-05-24 LAB — TSH: TSH: 0.09 m[IU]/L — ABNORMAL LOW (ref 0.40–4.50)

## 2017-05-24 LAB — LIPID PANEL
Cholesterol: 161 mg/dL (ref <200)
HDL: 60 mg/dL (ref >50)
LDL Cholesterol (Calc): 77 mg/dL (calc)
NON-HDL CHOLESTEROL (CALC): 101 mg/dL (ref <130)
Total CHOL/HDL Ratio: 2.7 (calc) (ref <5.0)
Triglycerides: 143 mg/dL (ref <150)

## 2017-05-30 ENCOUNTER — Encounter: Payer: Self-pay | Admitting: Internal Medicine

## 2017-05-30 ENCOUNTER — Ambulatory Visit (INDEPENDENT_AMBULATORY_CARE_PROVIDER_SITE_OTHER): Payer: 59 | Admitting: Internal Medicine

## 2017-05-30 VITALS — BP 110/68 | HR 63 | Temp 97.9°F | Ht 65.5 in | Wt 179.0 lb

## 2017-05-30 DIAGNOSIS — G4709 Other insomnia: Secondary | ICD-10-CM | POA: Diagnosis not present

## 2017-05-30 DIAGNOSIS — Z0000 Encounter for general adult medical examination without abnormal findings: Secondary | ICD-10-CM | POA: Diagnosis not present

## 2017-05-30 DIAGNOSIS — K219 Gastro-esophageal reflux disease without esophagitis: Secondary | ICD-10-CM | POA: Diagnosis not present

## 2017-05-30 DIAGNOSIS — I10 Essential (primary) hypertension: Secondary | ICD-10-CM | POA: Diagnosis not present

## 2017-05-30 DIAGNOSIS — E039 Hypothyroidism, unspecified: Secondary | ICD-10-CM | POA: Diagnosis not present

## 2017-05-30 DIAGNOSIS — N6019 Diffuse cystic mastopathy of unspecified breast: Secondary | ICD-10-CM | POA: Diagnosis not present

## 2017-05-30 DIAGNOSIS — F3289 Other specified depressive episodes: Secondary | ICD-10-CM

## 2017-05-30 DIAGNOSIS — Z85820 Personal history of malignant melanoma of skin: Secondary | ICD-10-CM

## 2017-05-30 LAB — POCT URINALYSIS DIPSTICK
Bilirubin, UA: NEGATIVE
Glucose, UA: NEGATIVE
Ketones, UA: NEGATIVE
Leukocytes, UA: NEGATIVE
NITRITE UA: NEGATIVE
PROTEIN UA: NEGATIVE
SPEC GRAV UA: 1.01 (ref 1.010–1.025)
UROBILINOGEN UA: 0.2 U/dL
pH, UA: 8 (ref 5.0–8.0)

## 2017-05-30 MED ORDER — LEVOTHYROXINE SODIUM 137 MCG PO TABS: 137.0000 ug | ORAL_TABLET | Freq: Every day | ORAL | 0 refills | Status: DC

## 2017-05-30 NOTE — Progress Notes (Signed)
Subjective:    Patient ID: Diana House, female    DOB: 12-May-1956, 61 y.o.   MRN: [id number]  HPI 61 year old Female in today for health maintenance.  History of hypothyroidism which requires changing dosage of medication from time to time.  She has a history of vitamin D deficiency, essential hypertension, depression, GE reflux, melanoma without recurrence and fibrocystic breast disease.  History of elevated liver enzymes evaluated by Dr. Earlean Shawl.  These improved after cutting back on alcohol consumption.  History of cervical disc disease status post surgery August 2000.  She had cervical disc surgery at C5-C6 and C6-C7.  Had lumbar micro discectomy right L5-S1 January 2018 by Dr.Stern.  History of depression symptoms are stable on medication.  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  GYN is Dr. Nori Riis.  She takes Prilosec for GE reflux.  She is on Lexapro and Wellbutrin for depression.  She takes HCTZ for hypertension.  Intolerant of Biaxin otherwise no known drug allergies.  She had a brachial plexus injury 1998.  Melanoma removed from her back 2005.  Surgery for strabismus at age 34 and at age 21.  Tonsillectomy in the past.  Uterine ablation per Dr. Nori Riis in 2005.  Social history: 3 adult children, 2 sons and a daughter.  One son is an Pension scheme manager and is getting a Conservator, museum/gallery at [employer] in [place].  Social alcohol consumption.  Family history: Father with history of coronary artery disease, diabetes, CABG and hypertension.  3 brothers and one sister in good health.  Mother with history of hypertension in good health.    Review of Systems does see chronic pain management physician for chronic musculoskeletal pain.     Objective:   Physical Exam  Constitutional: She is oriented to person, place, and time. She appears well-developed and well-nourished. No distress.  HENT:  Head:  Normocephalic and atraumatic.  Right Ear: External ear normal.  Left Ear: External ear normal.  Mouth/Throat: Oropharynx is clear and moist.  Eyes: Conjunctivae and EOM are normal. Pupils are equal, round, and reactive to light. Left eye exhibits no discharge.  Neck: Normal range of motion. Neck supple. No JVD present.  Cardiovascular: Normal rate, regular rhythm and normal heart sounds.  No murmur heard. Pulmonary/Chest: Effort normal and breath sounds normal. No respiratory distress. She has no wheezes. She has no rales.  Abdominal: Soft. Bowel sounds are normal. She exhibits no distension and no mass. There is no tenderness. There is no rebound and no guarding.  Genitourinary:  Genitourinary Comments: Deferred to GYN  Musculoskeletal: She exhibits no edema.  Neurological: She is alert and oriented to person, place, and time. She has normal reflexes. No cranial nerve deficit. Coordination normal.  Skin: Skin is warm and dry. No rash noted. She is not diaphoretic.  Psychiatric: She has a normal mood and affect. Her behavior is normal. Judgment and thought content normal.  Vitals reviewed.         Assessment & Plan:  Lumbar disc disease-MRI January 2018 showing severe right subarticular stenosis at L5-S1 with significant mass-effect on the traversing right S1 nerve root  Mild left subarticular narrowing L5-S1  Mild subarticular and foraminal narrowing bilaterally at L4-L5 worse on the right  History of cervical disc disease with multilevel facet arthropathy see MRI report 2016  Mild elevation of SGPT  TSH low at 0.09 and previously was 1.28 at 11  months ago.  Follow-up in December with TSH and liver panel.  Thyroid dose will be 0.137 mg daily.  Previously was on thyroid replacement 0.15 mg daily  Status post right L5-S1 microdiscectomy January 2018 by Dr.Stern  History of insomnia-takes Ambien

## 2017-06-01 ENCOUNTER — Other Ambulatory Visit: Payer: Self-pay | Admitting: Internal Medicine

## 2017-06-09 ENCOUNTER — Other Ambulatory Visit: Payer: Self-pay | Admitting: Internal Medicine

## 2017-06-13 NOTE — Patient Instructions (Signed)
Patient will follow-up in December for low TSH with office visit and lab.

## 2017-07-03 ENCOUNTER — Other Ambulatory Visit: Payer: Self-pay | Admitting: Internal Medicine

## 2017-07-03 DIAGNOSIS — R748 Abnormal levels of other serum enzymes: Secondary | ICD-10-CM

## 2017-07-03 DIAGNOSIS — E039 Hypothyroidism, unspecified: Secondary | ICD-10-CM

## 2017-07-03 NOTE — Addendum Note (Signed)
Addended by: Mady Haagensen on: 07/03/2017 03:28 PM   Modules accepted: Orders

## 2017-07-11 ENCOUNTER — Other Ambulatory Visit: Payer: 59 | Admitting: Internal Medicine

## 2017-07-11 DIAGNOSIS — G894 Chronic pain syndrome: Secondary | ICD-10-CM | POA: Diagnosis not present

## 2017-07-11 DIAGNOSIS — G5622 Lesion of ulnar nerve, left upper limb: Secondary | ICD-10-CM | POA: Diagnosis not present

## 2017-07-11 DIAGNOSIS — M47812 Spondylosis without myelopathy or radiculopathy, cervical region: Secondary | ICD-10-CM | POA: Diagnosis not present

## 2017-07-19 ENCOUNTER — Other Ambulatory Visit: Payer: Self-pay | Admitting: Internal Medicine

## 2017-07-25 ENCOUNTER — Other Ambulatory Visit: Payer: 59 | Admitting: Internal Medicine

## 2017-07-25 DIAGNOSIS — R748 Abnormal levels of other serum enzymes: Secondary | ICD-10-CM | POA: Diagnosis not present

## 2017-07-25 DIAGNOSIS — E039 Hypothyroidism, unspecified: Secondary | ICD-10-CM

## 2017-07-25 LAB — HEPATIC FUNCTION PANEL
AG RATIO: 1.7 (calc) (ref 1.0–2.5)
ALBUMIN MSPROF: 4.2 g/dL (ref 3.6–5.1)
ALT: 35 U/L — ABNORMAL HIGH (ref 6–29)
AST: 25 U/L (ref 10–35)
Alkaline phosphatase (APISO): 87 U/L (ref 33–130)
Bilirubin, Direct: 0.1 mg/dL (ref 0.0–0.2)
GLOBULIN: 2.5 g/dL (ref 1.9–3.7)
Indirect Bilirubin: 0.3 mg/dL (calc) (ref 0.2–1.2)
TOTAL PROTEIN: 6.7 g/dL (ref 6.1–8.1)
Total Bilirubin: 0.4 mg/dL (ref 0.2–1.2)

## 2017-07-25 LAB — TSH: TSH: 0.4 m[IU]/L (ref 0.40–4.50)

## 2017-08-12 ENCOUNTER — Other Ambulatory Visit: Payer: Self-pay | Admitting: Internal Medicine

## 2017-08-13 ENCOUNTER — Other Ambulatory Visit: Payer: Self-pay

## 2017-08-13 MED ORDER — LEVOTHYROXINE SODIUM 137 MCG PO TABS: 137.0000 ug | ORAL_TABLET | Freq: Every day | ORAL | 0 refills | Status: DC

## 2017-08-13 NOTE — Telephone Encounter (Signed)
Patient called to request a refill on her synthroid.

## 2017-08-18 ENCOUNTER — Other Ambulatory Visit: Payer: Self-pay | Admitting: Internal Medicine

## 2017-08-23 DIAGNOSIS — L738 Other specified follicular disorders: Secondary | ICD-10-CM | POA: Diagnosis not present

## 2017-08-23 DIAGNOSIS — Z85828 Personal history of other malignant neoplasm of skin: Secondary | ICD-10-CM | POA: Diagnosis not present

## 2017-08-23 DIAGNOSIS — L570 Actinic keratosis: Secondary | ICD-10-CM | POA: Diagnosis not present

## 2017-08-23 DIAGNOSIS — L821 Other seborrheic keratosis: Secondary | ICD-10-CM | POA: Diagnosis not present

## 2017-09-11 DIAGNOSIS — G5622 Lesion of ulnar nerve, left upper limb: Secondary | ICD-10-CM | POA: Diagnosis not present

## 2017-09-11 DIAGNOSIS — M47812 Spondylosis without myelopathy or radiculopathy, cervical region: Secondary | ICD-10-CM | POA: Diagnosis not present

## 2017-09-11 DIAGNOSIS — Z79891 Long term (current) use of opiate analgesic: Secondary | ICD-10-CM | POA: Diagnosis not present

## 2017-09-11 DIAGNOSIS — G894 Chronic pain syndrome: Secondary | ICD-10-CM | POA: Diagnosis not present

## 2017-09-26 DIAGNOSIS — H524 Presbyopia: Secondary | ICD-10-CM | POA: Diagnosis not present

## 2017-10-03 ENCOUNTER — Other Ambulatory Visit: Payer: Self-pay | Admitting: Internal Medicine

## 2017-11-07 DIAGNOSIS — G894 Chronic pain syndrome: Secondary | ICD-10-CM | POA: Diagnosis not present

## 2017-11-07 DIAGNOSIS — G5622 Lesion of ulnar nerve, left upper limb: Secondary | ICD-10-CM | POA: Diagnosis not present

## 2017-11-07 DIAGNOSIS — M47812 Spondylosis without myelopathy or radiculopathy, cervical region: Secondary | ICD-10-CM | POA: Diagnosis not present

## 2017-11-13 ENCOUNTER — Other Ambulatory Visit: Payer: Self-pay

## 2017-11-13 DIAGNOSIS — Z01419 Encounter for gynecological examination (general) (routine) without abnormal findings: Secondary | ICD-10-CM | POA: Diagnosis not present

## 2017-11-13 DIAGNOSIS — E039 Hypothyroidism, unspecified: Secondary | ICD-10-CM

## 2017-11-14 ENCOUNTER — Other Ambulatory Visit: Payer: Self-pay

## 2017-11-14 DIAGNOSIS — E039 Hypothyroidism, unspecified: Secondary | ICD-10-CM

## 2017-11-21 DIAGNOSIS — Z1212 Encounter for screening for malignant neoplasm of rectum: Secondary | ICD-10-CM | POA: Diagnosis not present

## 2017-11-21 DIAGNOSIS — Z1211 Encounter for screening for malignant neoplasm of colon: Secondary | ICD-10-CM | POA: Diagnosis not present

## 2017-11-28 ENCOUNTER — Other Ambulatory Visit: Payer: 59 | Admitting: Internal Medicine

## 2017-11-29 ENCOUNTER — Ambulatory Visit: Payer: 59 | Admitting: Internal Medicine

## 2017-12-03 ENCOUNTER — Other Ambulatory Visit: Payer: 59 | Admitting: Internal Medicine

## 2017-12-03 DIAGNOSIS — E039 Hypothyroidism, unspecified: Secondary | ICD-10-CM

## 2017-12-03 LAB — TSH: TSH: 0.19 m[IU]/L — AB (ref 0.40–4.50)

## 2017-12-04 ENCOUNTER — Telehealth: Payer: Self-pay | Admitting: Internal Medicine

## 2017-12-04 NOTE — Telephone Encounter (Signed)
LVM to CB and reschedule appt. 12/05/17

## 2017-12-05 ENCOUNTER — Encounter: Payer: Self-pay | Admitting: Internal Medicine

## 2017-12-05 ENCOUNTER — Ambulatory Visit: Payer: 59 | Admitting: Internal Medicine

## 2017-12-05 VITALS — BP 102/70 | HR 66 | Temp 98.1°F | Ht 65.5 in | Wt 182.0 lb

## 2017-12-05 DIAGNOSIS — E039 Hypothyroidism, unspecified: Secondary | ICD-10-CM | POA: Diagnosis not present

## 2017-12-05 DIAGNOSIS — M47812 Spondylosis without myelopathy or radiculopathy, cervical region: Secondary | ICD-10-CM | POA: Diagnosis not present

## 2017-12-05 DIAGNOSIS — G894 Chronic pain syndrome: Secondary | ICD-10-CM | POA: Diagnosis not present

## 2017-12-05 DIAGNOSIS — G5622 Lesion of ulnar nerve, left upper limb: Secondary | ICD-10-CM | POA: Diagnosis not present

## 2017-12-08 ENCOUNTER — Encounter: Payer: Self-pay | Admitting: Internal Medicine

## 2017-12-08 NOTE — Progress Notes (Signed)
   Subjective:    Patient ID: Diana House, female    DOB: 12/27/1955, 62 y.o.   MRN: [id number]  HPI patient here today to follow-up on hypothyroidism.  Depression is stable on current medication.  She was here in November 2018 and TSH was low at 0.09.  We decided to repeat TSH in January and level was 0.40 which was at the low end of normal.  We did not change her dose at that point time TSH is now low at 0.19    Review of Systems     Objective:   Physical Exam  Spent 15 minutes speaking with her about fluctuating TSH values.  Is taking medication on an empty stomach.      Assessment & Plan:  Hypothyroidism with low TSH  Plan: Decrease levothyroxine to 0.137 milligrams from 0.15 mg daily and follow-up in 6 weeks.  She will need TSH without office visit.

## 2017-12-08 NOTE — Patient Instructions (Signed)
Decrease levothyroxine to 0.137 mg daily and follow-up in 6 weeks with TSH without office visit.

## 2017-12-12 LAB — COLOGUARD: Cologuard: NEGATIVE

## 2018-01-17 ENCOUNTER — Other Ambulatory Visit: Payer: Self-pay | Admitting: Internal Medicine

## 2018-01-19 NOTE — Telephone Encounter (Signed)
Please book CPE after November 15th before refilling until then

## 2018-01-21 NOTE — Telephone Encounter (Signed)
She will come on Thursday for a TSH.

## 2018-01-23 ENCOUNTER — Other Ambulatory Visit: Payer: 59 | Admitting: Internal Medicine

## 2018-01-23 DIAGNOSIS — E039 Hypothyroidism, unspecified: Secondary | ICD-10-CM

## 2018-01-23 LAB — TSH: TSH: 0.41 m[IU]/L (ref 0.40–4.50)

## 2018-01-30 DIAGNOSIS — G5622 Lesion of ulnar nerve, left upper limb: Secondary | ICD-10-CM | POA: Diagnosis not present

## 2018-01-30 DIAGNOSIS — M47812 Spondylosis without myelopathy or radiculopathy, cervical region: Secondary | ICD-10-CM | POA: Diagnosis not present

## 2018-01-30 DIAGNOSIS — G894 Chronic pain syndrome: Secondary | ICD-10-CM | POA: Diagnosis not present

## 2018-02-10 ENCOUNTER — Other Ambulatory Visit: Payer: Self-pay | Admitting: Physical Medicine and Rehabilitation

## 2018-02-10 DIAGNOSIS — M5412 Radiculopathy, cervical region: Secondary | ICD-10-CM

## 2018-02-13 ENCOUNTER — Ambulatory Visit
Admission: RE | Admit: 2018-02-13 | Discharge: 2018-02-13 | Disposition: A | Discharge status: Home / Self Care | Payer: 59 | Source: Ambulatory Visit | Attending: Physical Medicine and Rehabilitation | Admitting: Physical Medicine and Rehabilitation

## 2018-02-13 DIAGNOSIS — M4322 Fusion of spine, cervical region: Secondary | ICD-10-CM | POA: Diagnosis not present

## 2018-02-13 DIAGNOSIS — M5412 Radiculopathy, cervical region: Secondary | ICD-10-CM

## 2018-02-13 MED ORDER — GADOBENATE DIMEGLUMINE 529 MG/ML IV SOLN
17.0000 mL | Freq: Once | INTRAVENOUS | Status: AC | PRN
  Administered 2018-02-13: 17 mL via INTRAVENOUS

## 2018-02-14 ENCOUNTER — Telehealth: Payer: Self-pay | Admitting: Internal Medicine

## 2018-02-14 NOTE — Telephone Encounter (Signed)
Patient called stating she received an automated call regarding time to get her Colonoscopy.  States she did the Cologuard through Dr. Verlon Au office in 12/2017.  Asked her to call his office and ask them to send that information over to [place] via fax so that we can scan that information into her chart.    Provided our fax number to patient and she is to call Dr. Verlon Au office to do that.

## 2018-02-17 ENCOUNTER — Encounter: Payer: Self-pay | Admitting: Internal Medicine

## 2018-02-27 DIAGNOSIS — G894 Chronic pain syndrome: Secondary | ICD-10-CM | POA: Diagnosis not present

## 2018-02-27 DIAGNOSIS — Z79891 Long term (current) use of opiate analgesic: Secondary | ICD-10-CM | POA: Diagnosis not present

## 2018-02-27 DIAGNOSIS — G5622 Lesion of ulnar nerve, left upper limb: Secondary | ICD-10-CM | POA: Diagnosis not present

## 2018-02-27 DIAGNOSIS — M47812 Spondylosis without myelopathy or radiculopathy, cervical region: Secondary | ICD-10-CM | POA: Diagnosis not present

## 2018-03-08 ENCOUNTER — Other Ambulatory Visit: Payer: Self-pay | Admitting: Internal Medicine

## 2018-03-20 ENCOUNTER — Other Ambulatory Visit: Payer: Self-pay | Admitting: Internal Medicine

## 2018-04-07 ENCOUNTER — Other Ambulatory Visit: Payer: Self-pay | Admitting: Internal Medicine

## 2018-04-24 DIAGNOSIS — G894 Chronic pain syndrome: Secondary | ICD-10-CM | POA: Diagnosis not present

## 2018-04-24 DIAGNOSIS — G5622 Lesion of ulnar nerve, left upper limb: Secondary | ICD-10-CM | POA: Diagnosis not present

## 2018-04-24 DIAGNOSIS — M47812 Spondylosis without myelopathy or radiculopathy, cervical region: Secondary | ICD-10-CM | POA: Diagnosis not present

## 2018-06-03 ENCOUNTER — Other Ambulatory Visit: Payer: 59 | Admitting: Internal Medicine

## 2018-06-05 ENCOUNTER — Encounter: Payer: 59 | Admitting: Internal Medicine

## 2018-06-13 ENCOUNTER — Other Ambulatory Visit: Payer: Self-pay | Admitting: Internal Medicine

## 2018-06-26 DIAGNOSIS — G5622 Lesion of ulnar nerve, left upper limb: Secondary | ICD-10-CM | POA: Diagnosis not present

## 2018-06-26 DIAGNOSIS — M47812 Spondylosis without myelopathy or radiculopathy, cervical region: Secondary | ICD-10-CM | POA: Diagnosis not present

## 2018-06-26 DIAGNOSIS — G894 Chronic pain syndrome: Secondary | ICD-10-CM | POA: Diagnosis not present

## 2018-07-27 ENCOUNTER — Other Ambulatory Visit: Payer: Self-pay | Admitting: Internal Medicine

## 2018-08-12 ENCOUNTER — Other Ambulatory Visit: Payer: 59 | Admitting: Internal Medicine

## 2018-08-12 DIAGNOSIS — G4709 Other insomnia: Secondary | ICD-10-CM

## 2018-08-12 DIAGNOSIS — I10 Essential (primary) hypertension: Secondary | ICD-10-CM

## 2018-08-12 DIAGNOSIS — Z0000 Encounter for general adult medical examination without abnormal findings: Secondary | ICD-10-CM

## 2018-08-12 DIAGNOSIS — K219 Gastro-esophageal reflux disease without esophagitis: Secondary | ICD-10-CM

## 2018-08-12 DIAGNOSIS — Z85820 Personal history of malignant melanoma of skin: Secondary | ICD-10-CM

## 2018-08-12 DIAGNOSIS — F3289 Other specified depressive episodes: Secondary | ICD-10-CM

## 2018-08-12 DIAGNOSIS — E039 Hypothyroidism, unspecified: Secondary | ICD-10-CM | POA: Diagnosis not present

## 2018-08-13 LAB — LIPID PANEL
Cholesterol: 172 mg/dL (ref <200)
HDL: 62 mg/dL (ref >50)
LDL CHOLESTEROL (CALC): 89 mg/dL
Non-HDL Cholesterol (Calc): 110 mg/dL (calc) (ref <130)
TRIGLYCERIDES: 112 mg/dL (ref <150)
Total CHOL/HDL Ratio: 2.8 (calc) (ref <5.0)

## 2018-08-13 LAB — COMPLETE METABOLIC PANEL WITH GFR
AG RATIO: 1.6 (calc) (ref 1.0–2.5)
ALBUMIN MSPROF: 3.9 g/dL (ref 3.6–5.1)
ALT: 33 U/L — AB (ref 6–29)
AST: 27 U/L (ref 10–35)
Alkaline phosphatase (APISO): 78 U/L (ref 33–130)
BUN: 10 mg/dL (ref 7–25)
CO2: 29 mmol/L (ref 20–32)
Calcium: 9.3 mg/dL (ref 8.6–10.4)
Chloride: 101 mmol/L (ref 98–110)
Creat: 0.71 mg/dL (ref 0.50–0.99)
GFR, EST NON AFRICAN AMERICAN: 92 mL/min/{1.73_m2} (ref >=60)
GFR, Est African American: 107 mL/min/{1.73_m2} (ref >=60)
Globulin: 2.5 g/dL (calc) (ref 1.9–3.7)
Glucose, Bld: 102 mg/dL — ABNORMAL HIGH (ref 65–99)
POTASSIUM: 4.2 mmol/L (ref 3.5–5.3)
Sodium: 140 mmol/L (ref 135–146)
Total Bilirubin: 0.5 mg/dL (ref 0.2–1.2)
Total Protein: 6.4 g/dL (ref 6.1–8.1)

## 2018-08-13 LAB — TSH: TSH: 0.98 m[IU]/L (ref 0.40–4.50)

## 2018-08-13 LAB — CBC WITH DIFFERENTIAL/PLATELET
ABSOLUTE MONOCYTES: 382 {cells}/uL (ref 200–950)
Basophils Absolute: 60 cells/uL (ref 0–200)
Basophils Relative: 0.9 %
Eosinophils Absolute: 442 cells/uL (ref 15–500)
Eosinophils Relative: 6.6 %
HCT: 37.3 % (ref 35.0–45.0)
Hemoglobin: 12.6 g/dL (ref 11.7–15.5)
Lymphs Abs: 1802 cells/uL (ref 850–3900)
MCH: 34.1 pg — ABNORMAL HIGH (ref 27.0–33.0)
MCHC: 33.8 g/dL (ref 32.0–36.0)
MCV: 100.8 fL — AB (ref 80.0–100.0)
MONOS PCT: 5.7 %
MPV: 11.5 fL (ref 7.5–12.5)
NEUTROS PCT: 59.9 %
Neutro Abs: 4013 cells/uL (ref 1500–7800)
PLATELETS: 255 10*3/uL (ref 140–400)
RBC: 3.7 10*6/uL — AB (ref 3.80–5.10)
RDW: 11.8 % (ref 11.0–15.0)
TOTAL LYMPHOCYTE: 26.9 %
WBC: 6.7 10*3/uL (ref 3.8–10.8)

## 2018-08-13 LAB — VITAMIN D 25 HYDROXY (VIT D DEFICIENCY, FRACTURES): Vit D, 25-Hydroxy: 38 ng/mL (ref 30–100)

## 2018-08-14 ENCOUNTER — Encounter: Payer: Self-pay | Admitting: Internal Medicine

## 2018-08-14 ENCOUNTER — Ambulatory Visit (INDEPENDENT_AMBULATORY_CARE_PROVIDER_SITE_OTHER): Payer: 59 | Admitting: Internal Medicine

## 2018-08-14 VITALS — BP 110/70 | HR 70 | Temp 98.6°F | Ht 65.5 in | Wt 182.0 lb

## 2018-08-14 DIAGNOSIS — Z85820 Personal history of malignant melanoma of skin: Secondary | ICD-10-CM

## 2018-08-14 DIAGNOSIS — G4709 Other insomnia: Secondary | ICD-10-CM

## 2018-08-14 DIAGNOSIS — I10 Essential (primary) hypertension: Secondary | ICD-10-CM

## 2018-08-14 DIAGNOSIS — M519 Unspecified thoracic, thoracolumbar and lumbosacral intervertebral disc disorder: Secondary | ICD-10-CM

## 2018-08-14 DIAGNOSIS — N6019 Diffuse cystic mastopathy of unspecified breast: Secondary | ICD-10-CM

## 2018-08-14 DIAGNOSIS — M5090 Cervical disc disorder, unspecified, unspecified cervical region: Secondary | ICD-10-CM

## 2018-08-14 DIAGNOSIS — Z0000 Encounter for general adult medical examination without abnormal findings: Secondary | ICD-10-CM | POA: Diagnosis not present

## 2018-08-14 DIAGNOSIS — F3289 Other specified depressive episodes: Secondary | ICD-10-CM | POA: Diagnosis not present

## 2018-08-14 DIAGNOSIS — H9311 Tinnitus, right ear: Secondary | ICD-10-CM

## 2018-08-14 DIAGNOSIS — K219 Gastro-esophageal reflux disease without esophagitis: Secondary | ICD-10-CM

## 2018-08-14 LAB — POCT URINALYSIS DIPSTICK
Appearance: NEGATIVE
Bilirubin, UA: NEGATIVE
Blood, UA: NEGATIVE
Glucose, UA: NEGATIVE
Ketones, UA: NEGATIVE
Leukocytes, UA: NEGATIVE
NITRITE UA: NEGATIVE
Odor: NEGATIVE
Protein, UA: NEGATIVE
SPEC GRAV UA: 1.015 (ref 1.010–1.025)
Urobilinogen, UA: 0.2 E.U./dL
pH, UA: 7.5 (ref 5.0–8.0)

## 2018-08-14 NOTE — Progress Notes (Signed)
Subjective:    Patient ID: Diana House, female    DOB: [birth year], 63 y.o.   MRN: [id number]  HPI 63 year old female in today for health maintenance exam and evaluation of medical issues.  History of hypothyroidism.  Is on thyroid replacement medication and feels okay on current dosage.  History of essential hypertension.  History of depression and is on Lexapro.  Had flu vaccine through employment.  New issue today is humming or roaring in right ear for several weeks.  She is concerned that there could be something serious such as an acoustic neuroma and will be sent to Dr. Wilburn Cornelia at [facility].  History of vitamin D deficiency.  History of essential hypertension and GE reflux.  History of elevated liver enzymes evaluated by Dr. Earlean Shawl.  These improved after cutting back on alcohol consumption.  History of fibrocystic breast disease and has annual mammogram.  History of cervical disc disease status post surgery August 2000.  She had cervical disc surgery at C5-C6 and C6-C7.  Had lumbar microdiscectomy right L5-S1 January 2018 by Dr. Vertell Limber.  History of depression symptoms stable with medication.  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  This is helped her chronic neck pain.  GYN is Dr. Nori Riis.  Diana House for GE reflux.  Takes Lexapro and Wellbutrin for depression.  Takes HCTZ for hypertension.  Intolerant of Biaxin otherwise no known drug allergies.  She had brachial plexus injury 1998.  Melanoma removed from her back in 2005.  Surgery for strabismus at age 9 and at age 33.  Tonsillectomy in the past.  Uterine ablation per Dr. Nori Riis in 2005.  Social history: 3 adult children, 2 sons and a daughter.  One son is an Pension scheme manager and is getting a Scientist, water quality at [employer] and trauma.  Social alcohol consumption.  Family history: Father with history of coronary artery disease, diabetes, CABG and  hypertension.  3 brothers and 1 sister in good health.  Mother with history of hypertension in good health.    Review of Systems sees chronic pain management physician for chronic musculoskeletal pain.     Objective:   Physical Exam Constitutional:      Appearance: Normal appearance.  HENT:     Head: Normocephalic and atraumatic.     Right Ear: Tympanic membrane and ear canal normal. There is no impacted cerumen.     Left Ear: Tympanic membrane and ear canal normal. There is no impacted cerumen.     Nose: Nose normal.     Mouth/Throat:     Mouth: Mucous membranes are moist.     Pharynx: Oropharynx is clear. No oropharyngeal exudate.  Eyes:     General: No scleral icterus.       Right eye: No discharge.        Left eye: No discharge.     Conjunctiva/sclera: Conjunctivae normal.     Pupils: Pupils are equal, round, and reactive to light.  Neck:     Musculoskeletal: Neck supple. No neck rigidity.  Cardiovascular:     Rate and Rhythm: Normal rate and regular rhythm.     Pulses: Normal pulses.     Heart sounds: Normal heart sounds. No murmur.  Pulmonary:     Effort: Pulmonary effort is normal. No respiratory distress.     Breath sounds: Normal breath sounds. No wheezing or rales.  Abdominal:     General: There is no  distension.     Palpations: Abdomen is soft. There is no mass.     Tenderness: There is no abdominal tenderness. There is no guarding or rebound.     Hernia: No hernia is present.  Genitourinary:    Comments: Deferred to GYN Musculoskeletal:        General: No deformity.     Right lower leg: No edema.     Left lower leg: No edema.  Lymphadenopathy:     Cervical: No cervical adenopathy.  Skin:    General: Skin is warm and dry.     Coloration: Skin is not jaundiced.     Findings: No bruising or rash.  Neurological:     General: No focal deficit present.     Mental Status: She is alert and oriented to person, place, and time.     Cranial Nerves: No cranial  nerve deficit.     Sensory: No sensory deficit.     Coordination: Coordination normal.     Gait: Gait normal.  Psychiatric:        Mood and Affect: Mood normal.        Behavior: Behavior normal.        Thought Content: Thought content normal.        Judgment: Judgment normal.    Vital signs reviewed       Assessment & Plan:  Hypothyroidism stable on this current dose of thyroid replacement  History of depression-stable with Lexapro and Wellbutrin  GE reflux treated with House  Hypertension treated with HCTZ  History of chronic neck pain  History of melanoma  Chronic pain sees chronic pain management physician  History of lumbar disc disease with MRI in 2018 showing right subarticular stenosis severe at L5-S1 with significant mass-effect on traversing right S1 nerve root  History of cervical disc disease with multilevel facet arthropathy-see MRI report 2016  History of elevated liver functions seen by Dr. Earlean Shawl.  Improved with decreasing alcohol consumption  Roaring in right ear-to see Dr. Wilburn Cornelia  History of insomnia treated with Ambien  History of vitamin D deficiency  Return in 6 months or as needed.

## 2018-08-15 NOTE — Patient Instructions (Signed)
We will make appointment with ENT physician regarding roaring in your right ear.  Continue current medications and follow-up in 6 months.

## 2018-08-19 ENCOUNTER — Other Ambulatory Visit: Payer: Self-pay | Admitting: Internal Medicine

## 2018-08-27 DIAGNOSIS — G5622 Lesion of ulnar nerve, left upper limb: Secondary | ICD-10-CM | POA: Diagnosis not present

## 2018-08-27 DIAGNOSIS — M47812 Spondylosis without myelopathy or radiculopathy, cervical region: Secondary | ICD-10-CM | POA: Diagnosis not present

## 2018-08-27 DIAGNOSIS — G894 Chronic pain syndrome: Secondary | ICD-10-CM | POA: Diagnosis not present

## 2018-08-29 DIAGNOSIS — Z85828 Personal history of other malignant neoplasm of skin: Secondary | ICD-10-CM | POA: Diagnosis not present

## 2018-08-29 DIAGNOSIS — L570 Actinic keratosis: Secondary | ICD-10-CM | POA: Diagnosis not present

## 2018-08-29 DIAGNOSIS — L578 Other skin changes due to chronic exposure to nonionizing radiation: Secondary | ICD-10-CM | POA: Diagnosis not present

## 2018-08-29 DIAGNOSIS — L814 Other melanin hyperpigmentation: Secondary | ICD-10-CM | POA: Diagnosis not present

## 2018-09-06 ENCOUNTER — Other Ambulatory Visit: Payer: Self-pay | Admitting: Internal Medicine

## 2018-09-24 ENCOUNTER — Other Ambulatory Visit: Payer: Self-pay | Admitting: Internal Medicine

## 2018-10-27 DIAGNOSIS — Z79891 Long term (current) use of opiate analgesic: Secondary | ICD-10-CM | POA: Diagnosis not present

## 2018-10-27 DIAGNOSIS — M47812 Spondylosis without myelopathy or radiculopathy, cervical region: Secondary | ICD-10-CM | POA: Diagnosis not present

## 2018-10-27 DIAGNOSIS — G894 Chronic pain syndrome: Secondary | ICD-10-CM | POA: Diagnosis not present

## 2018-10-27 DIAGNOSIS — G5622 Lesion of ulnar nerve, left upper limb: Secondary | ICD-10-CM | POA: Diagnosis not present

## 2019-02-03 ENCOUNTER — Other Ambulatory Visit: Payer: Self-pay

## 2019-02-03 ENCOUNTER — Other Ambulatory Visit: Payer: 59 | Admitting: Internal Medicine

## 2019-02-03 DIAGNOSIS — R7989 Other specified abnormal findings of blood chemistry: Secondary | ICD-10-CM

## 2019-02-03 DIAGNOSIS — E039 Hypothyroidism, unspecified: Secondary | ICD-10-CM

## 2019-02-04 LAB — HEPATIC FUNCTION PANEL
AG Ratio: 1.6 (calc) (ref 1.0–2.5)
ALT: 54 U/L — ABNORMAL HIGH (ref 6–29)
AST: 45 U/L — ABNORMAL HIGH (ref 10–35)
Albumin: 4 g/dL (ref 3.6–5.1)
Alkaline phosphatase (APISO): 86 U/L (ref 37–153)
Bilirubin, Direct: 0.1 mg/dL (ref 0.0–0.2)
Globulin: 2.5 g/dL (calc) (ref 1.9–3.7)
Indirect Bilirubin: 0.3 mg/dL (calc) (ref 0.2–1.2)
Total Bilirubin: 0.4 mg/dL (ref 0.2–1.2)
Total Protein: 6.5 g/dL (ref 6.1–8.1)

## 2019-02-04 LAB — TSH: TSH: 1.97 mIU/L (ref 0.40–4.50)

## 2019-02-05 ENCOUNTER — Encounter: Payer: Self-pay | Admitting: Internal Medicine

## 2019-02-05 ENCOUNTER — Ambulatory Visit: Payer: 59 | Admitting: Internal Medicine

## 2019-02-05 VITALS — BP 120/70 | HR 64 | Temp 98.4°F | Ht 65.5 in | Wt 181.0 lb

## 2019-02-05 DIAGNOSIS — N6019 Diffuse cystic mastopathy of unspecified breast: Secondary | ICD-10-CM | POA: Diagnosis not present

## 2019-02-05 DIAGNOSIS — E039 Hypothyroidism, unspecified: Secondary | ICD-10-CM | POA: Diagnosis not present

## 2019-02-05 DIAGNOSIS — F3289 Other specified depressive episodes: Secondary | ICD-10-CM

## 2019-02-05 DIAGNOSIS — I10 Essential (primary) hypertension: Secondary | ICD-10-CM

## 2019-02-05 DIAGNOSIS — R748 Abnormal levels of other serum enzymes: Secondary | ICD-10-CM | POA: Diagnosis not present

## 2019-02-05 NOTE — Progress Notes (Signed)
   Subjective:    Patient ID: Diana House, female    DOB: 1955/09/07, 63 y.o.   MRN: [id number]  HPI  63 year old Female for follow up on hypertension, hypothyroidism depression and elevated liver functions.  History of GE reflux and vitamin D deficiency.  Elated liver enzymes have been evaluated by Dr. Chipper Herb.  These improved after cutting back on alcohol consumption. Sound of liver in 2014 compatible with fatty infiltration.  Takes Lexapro and Wellbutrin for depression, metoprolol and HCTZ for hypertension, meloxicam for musculoskeletal pain   Review of Systems tinnnitus resolved     Objective:   Physical Exam No thyromegaly.  Chest clear.  Cardiac exam regular rate and rhythm normal S1 and S2.  Extremities without edema.  No hepatosplenomegaly.  TSH is normal on current dose of thyroid replacement medication.  SGOT has increased from 27 in January  To 41.  SGPT has increased from 33 January to 54.  Alkaline phosphatase is normal.     Assessment & Plan:  Hypothyroidism-continue same dose of thyroid replacement  Elevated liver functions with history of fatty liver infiltration.  Work on diet exercise and weight loss.  Recheck in 3 months without office visit  Essential hypertension stable on current regimen.  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  Plan: See above return in 3 months for liver panel only.  Physical exam due in 6 months.

## 2019-02-10 ENCOUNTER — Encounter: Payer: Self-pay | Admitting: Internal Medicine

## 2019-02-10 NOTE — Patient Instructions (Signed)
It was a pleasure to see you today.  Please return in 3 months for liver function test without office visit.  Try to get some exercise and lose a bit of weight.  This may help fatty liver.  Continue other medications as previously prescribed.

## 2019-03-13 ENCOUNTER — Other Ambulatory Visit: Payer: Self-pay | Admitting: Internal Medicine

## 2019-05-07 ENCOUNTER — Other Ambulatory Visit: Payer: Self-pay

## 2019-05-07 ENCOUNTER — Other Ambulatory Visit: Payer: 59 | Admitting: Internal Medicine

## 2019-05-07 DIAGNOSIS — R7989 Other specified abnormal findings of blood chemistry: Secondary | ICD-10-CM

## 2019-05-08 LAB — HEPATIC FUNCTION PANEL
AG Ratio: 1.7 (calc) (ref 1.0–2.5)
ALT: 46 U/L — ABNORMAL HIGH (ref 6–29)
AST: 35 U/L (ref 10–35)
Albumin: 4 g/dL (ref 3.6–5.1)
Alkaline phosphatase (APISO): 80 U/L (ref 37–153)
Bilirubin, Direct: 0.1 mg/dL (ref 0.0–0.2)
Globulin: 2.3 g/dL (calc) (ref 1.9–3.7)
Indirect Bilirubin: 0.2 mg/dL (calc) (ref 0.2–1.2)
Total Bilirubin: 0.3 mg/dL (ref 0.2–1.2)
Total Protein: 6.3 g/dL (ref 6.1–8.1)

## 2019-05-15 ENCOUNTER — Other Ambulatory Visit: Payer: Self-pay | Admitting: Internal Medicine

## 2019-07-22 ENCOUNTER — Other Ambulatory Visit: Payer: Self-pay | Admitting: Internal Medicine

## 2019-07-26 ENCOUNTER — Other Ambulatory Visit: Payer: Self-pay | Admitting: Internal Medicine

## 2019-08-13 ENCOUNTER — Other Ambulatory Visit: Payer: 59 | Admitting: Internal Medicine

## 2019-08-13 ENCOUNTER — Other Ambulatory Visit: Payer: Self-pay

## 2019-08-13 DIAGNOSIS — M519 Unspecified thoracic, thoracolumbar and lumbosacral intervertebral disc disorder: Secondary | ICD-10-CM

## 2019-08-13 DIAGNOSIS — F3289 Other specified depressive episodes: Secondary | ICD-10-CM

## 2019-08-13 DIAGNOSIS — N6019 Diffuse cystic mastopathy of unspecified breast: Secondary | ICD-10-CM

## 2019-08-13 DIAGNOSIS — Z8639 Personal history of other endocrine, nutritional and metabolic disease: Secondary | ICD-10-CM

## 2019-08-13 DIAGNOSIS — E039 Hypothyroidism, unspecified: Secondary | ICD-10-CM

## 2019-08-13 DIAGNOSIS — Z0000 Encounter for general adult medical examination without abnormal findings: Secondary | ICD-10-CM

## 2019-08-13 DIAGNOSIS — Z1321 Encounter for screening for nutritional disorder: Secondary | ICD-10-CM

## 2019-08-13 DIAGNOSIS — Z85820 Personal history of malignant melanoma of skin: Secondary | ICD-10-CM

## 2019-08-13 DIAGNOSIS — M5090 Cervical disc disorder, unspecified, unspecified cervical region: Secondary | ICD-10-CM

## 2019-08-13 DIAGNOSIS — I10 Essential (primary) hypertension: Secondary | ICD-10-CM

## 2019-08-14 LAB — COMPLETE METABOLIC PANEL WITH GFR
AG Ratio: 1.7 (calc) (ref 1.0–2.5)
ALT: 40 U/L — ABNORMAL HIGH (ref 6–29)
AST: 29 U/L (ref 10–35)
Albumin: 4 g/dL (ref 3.6–5.1)
Alkaline phosphatase (APISO): 82 U/L (ref 37–153)
BUN: 9 mg/dL (ref 7–25)
CO2: 30 mmol/L (ref 20–32)
Calcium: 9 mg/dL (ref 8.6–10.4)
Chloride: 101 mmol/L (ref 98–110)
Creat: 0.7 mg/dL (ref 0.50–0.99)
GFR, Est African American: 108 mL/min/{1.73_m2} (ref >=60)
GFR, Est Non African American: 93 mL/min/{1.73_m2} (ref >=60)
Globulin: 2.4 g/dL (calc) (ref 1.9–3.7)
Glucose, Bld: 97 mg/dL (ref 65–99)
Potassium: 4.6 mmol/L (ref 3.5–5.3)
Sodium: 138 mmol/L (ref 135–146)
Total Bilirubin: 0.5 mg/dL (ref 0.2–1.2)
Total Protein: 6.4 g/dL (ref 6.1–8.1)

## 2019-08-14 LAB — CBC WITH DIFFERENTIAL/PLATELET
Absolute Monocytes: 413 cells/uL (ref 200–950)
Basophils Absolute: 71 cells/uL (ref 0–200)
Basophils Relative: 1.2 %
Eosinophils Absolute: 425 cells/uL (ref 15–500)
Eosinophils Relative: 7.2 %
HCT: 35.9 % (ref 35.0–45.0)
Hemoglobin: 12.2 g/dL (ref 11.7–15.5)
Lymphs Abs: 2201 cells/uL (ref 850–3900)
MCH: 34 pg — ABNORMAL HIGH (ref 27.0–33.0)
MCHC: 34 g/dL (ref 32.0–36.0)
MCV: 100 fL (ref 80.0–100.0)
MPV: 11.6 fL (ref 7.5–12.5)
Monocytes Relative: 7 %
Neutro Abs: 2791 cells/uL (ref 1500–7800)
Neutrophils Relative %: 47.3 %
Platelets: 238 10*3/uL (ref 140–400)
RBC: 3.59 10*6/uL — ABNORMAL LOW (ref 3.80–5.10)
RDW: 11.8 % (ref 11.0–15.0)
Total Lymphocyte: 37.3 %
WBC: 5.9 10*3/uL (ref 3.8–10.8)

## 2019-08-14 LAB — LIPID PANEL
Cholesterol: 168 mg/dL (ref <200)
HDL: 53 mg/dL (ref >=50)
LDL Cholesterol (Calc): 90 mg/dL (calc)
Non-HDL Cholesterol (Calc): 115 mg/dL (calc) (ref <130)
Total CHOL/HDL Ratio: 3.2 (calc) (ref <5.0)
Triglycerides: 151 mg/dL — ABNORMAL HIGH (ref <150)

## 2019-08-14 LAB — VITAMIN D 25 HYDROXY (VIT D DEFICIENCY, FRACTURES): Vit D, 25-Hydroxy: 31 ng/mL (ref 30–100)

## 2019-08-14 LAB — TSH: TSH: 0.89 mIU/L (ref 0.40–4.50)

## 2019-08-20 ENCOUNTER — Encounter: Payer: Self-pay | Admitting: Internal Medicine

## 2019-08-20 ENCOUNTER — Ambulatory Visit: Payer: 59 | Admitting: Internal Medicine

## 2019-08-20 ENCOUNTER — Other Ambulatory Visit: Payer: Self-pay

## 2019-08-20 VITALS — BP 120/70 | HR 53 | Ht 65.0 in | Wt 187.0 lb

## 2019-08-20 DIAGNOSIS — I10 Essential (primary) hypertension: Secondary | ICD-10-CM | POA: Diagnosis not present

## 2019-08-20 DIAGNOSIS — E039 Hypothyroidism, unspecified: Secondary | ICD-10-CM

## 2019-08-20 DIAGNOSIS — R7401 Elevation of levels of liver transaminase levels: Secondary | ICD-10-CM

## 2019-08-20 DIAGNOSIS — M5090 Cervical disc disorder, unspecified, unspecified cervical region: Secondary | ICD-10-CM | POA: Diagnosis not present

## 2019-08-20 DIAGNOSIS — F3289 Other specified depressive episodes: Secondary | ICD-10-CM | POA: Diagnosis not present

## 2019-08-20 DIAGNOSIS — Z0000 Encounter for general adult medical examination without abnormal findings: Secondary | ICD-10-CM | POA: Diagnosis not present

## 2019-08-20 LAB — POCT URINALYSIS DIPSTICK
Appearance: NEGATIVE
Bilirubin, UA: NEGATIVE
Blood, UA: NEGATIVE
Glucose, UA: NEGATIVE
Ketones, UA: NEGATIVE
Leukocytes, UA: NEGATIVE
Nitrite, UA: NEGATIVE
Odor: NEGATIVE
Protein, UA: NEGATIVE
Spec Grav, UA: 1.01 (ref 1.010–1.025)
Urobilinogen, UA: 0.2 E.U./dL
pH, UA: 6.5 (ref 5.0–8.0)

## 2019-08-20 NOTE — Progress Notes (Signed)
Subjective:    Patient ID: Diana House, female    DOB: [birth year], 64 y.o.   MRN: [id number]  HPI 64 year old Female for health maintenance exam and evaluation of medical issues.  General health has been excellent.  She is enjoying retirement.  History of essential hypertension, history of depression with SSRI.  History of hypothyroidism.  History of essential hypertension, GE reflux and vitamin D deficiency.  History of elevated liver enzymes evaluated by Dr. Earlean Shawl and improved after cutting back on alcohol consumption.  History of fibrocystic breast disease followed by annual mammography.  History of cervical disc disease status post surgery August 2000.  She had cervical disc surgery at C5-C6 and C6-C7.  Had lumbar microdiscectomy at L5-S1 January 2018 by Dr. Vertell Limber.  Intolerant of Biaxin.  Had brachial plexus injury 1998.  Melanoma removed from her back in 2005.  Surgery for strabismus at age 23 and at age 89.  Tonsillectomy in the remote past.  Uterine ablation by Dr. Nori Riis in 2005.  Social history: 3 adult children, 2 sons and a daughter.  Social alcohol consumption.  Married.  Family history: Father with history of coronary artery disease, diabetes CABG and hypertension.  3 brothers and 1 sister in good health.  Mother with history of hypertension living in good health.    Review of Systems  Constitutional: Negative.   Respiratory: Negative.   Cardiovascular: Negative.   Gastrointestinal: Negative.   Genitourinary: Negative.   Psychiatric/Behavioral: Negative.         Objective:   Physical Exam Vitals reviewed.  Constitutional:      Appearance: Normal appearance.  HENT:     Head: Normocephalic and atraumatic.     Right Ear: Tympanic membrane normal.     Left Ear: Tympanic membrane normal.     Mouth/Throat:     Mouth: Mucous membranes are moist.     Pharynx: Oropharynx is clear.  Eyes:     General: No scleral icterus.       Right eye: No discharge.        Left  eye: No discharge.     Extraocular Movements: Extraocular movements intact.     Conjunctiva/sclera: Conjunctivae normal.     Pupils: Pupils are equal, round, and reactive to light.  Cardiovascular:     Rate and Rhythm: Normal rate and regular rhythm.     Heart sounds: No murmur.  Pulmonary:     Effort: No respiratory distress.     Breath sounds: Normal breath sounds. No wheezing or rales.     Comments: Breasts without masses Abdominal:     General: Bowel sounds are normal.     Palpations: Abdomen is soft.     Tenderness: There is no rebound.  Genitourinary:    Comments: Deferred to GYN Musculoskeletal:     Cervical back: Neck supple.  Lymphadenopathy:     Cervical: No cervical adenopathy.  Skin:    General: Skin is warm and dry.     Findings: No rash.  Neurological:     General: No focal deficit present.     Mental Status: She is alert and oriented to person, place, and time.     Cranial Nerves: No cranial nerve deficit.  Psychiatric:        Mood and Affect: Mood normal.        Thought Content: Thought content normal.        Judgment: Judgment normal.  Assessment & Plan:  Normal health maintenance exam  History of cervical neck pain and lumbar neck pain-stable  GE reflux treated with Prilosec  History of depression treated with Lexapro and Wellbutrin  History of hypertension treated with HCTZ and metoprolol  Remote history of melanoma on her back with no recurrence  History of elevated liver functions seen by Dr. Earlean Shawl in the past but improved with watching alcohol consumption.  SGPT is 40 and SGOT is normal at 29.  History of vitamin D deficiency-takes vitamin D supplement  Hypothyroidism-stable on levothyroxine 0.137 mg daily  Estrogen replacement per Dr. Nori Riis  Plan: Return in 6 months for liver panel TSH and office visit.  Take minimum 4000 units vitamin D3 daily.  Vitamin D level is low normal at 31.

## 2019-09-12 NOTE — Patient Instructions (Addendum)
It was a pleasure to see you today.  Continue current medications and follow-up in 6 months for TSH and liver panel.  Take minimum 4000 units vitamin D3 daily.

## 2019-09-19 ENCOUNTER — Other Ambulatory Visit: Payer: Self-pay | Admitting: Internal Medicine

## 2019-10-11 ENCOUNTER — Other Ambulatory Visit: Payer: Self-pay | Admitting: Internal Medicine

## 2019-11-10 ENCOUNTER — Other Ambulatory Visit: Payer: Self-pay | Admitting: Physical Medicine and Rehabilitation

## 2019-11-10 DIAGNOSIS — M5412 Radiculopathy, cervical region: Secondary | ICD-10-CM

## 2019-11-11 ENCOUNTER — Other Ambulatory Visit: Payer: Self-pay | Admitting: Internal Medicine

## 2019-11-24 ENCOUNTER — Ambulatory Visit
Admission: RE | Admit: 2019-11-24 | Discharge: 2019-11-24 | Disposition: A | Discharge status: Home / Self Care | Payer: 59 | Source: Ambulatory Visit | Attending: Physical Medicine and Rehabilitation | Admitting: Physical Medicine and Rehabilitation

## 2019-11-24 DIAGNOSIS — M5412 Radiculopathy, cervical region: Secondary | ICD-10-CM

## 2019-11-24 MED ORDER — GADOBENATE DIMEGLUMINE 529 MG/ML IV SOLN
17.0000 mL | Freq: Once | INTRAVENOUS | Status: AC | PRN
  Administered 2019-11-24: 17 mL via INTRAVENOUS

## 2020-01-31 ENCOUNTER — Other Ambulatory Visit: Payer: Self-pay | Admitting: Internal Medicine

## 2020-02-09 ENCOUNTER — Other Ambulatory Visit: Payer: Self-pay

## 2020-02-09 ENCOUNTER — Other Ambulatory Visit: Payer: 59 | Admitting: Internal Medicine

## 2020-02-09 DIAGNOSIS — R748 Abnormal levels of other serum enzymes: Secondary | ICD-10-CM

## 2020-02-09 DIAGNOSIS — E039 Hypothyroidism, unspecified: Secondary | ICD-10-CM

## 2020-02-09 LAB — HEPATIC FUNCTION PANEL
AG Ratio: 1.7 (calc) (ref 1.0–2.5)
ALT: 53 U/L — ABNORMAL HIGH (ref 6–29)
AST: 36 U/L — ABNORMAL HIGH (ref 10–35)
Albumin: 4.1 g/dL (ref 3.6–5.1)
Alkaline phosphatase (APISO): 80 U/L (ref 37–153)
Bilirubin, Direct: 0.1 mg/dL (ref 0.0–0.2)
Globulin: 2.4 g/dL (calc) (ref 1.9–3.7)
Indirect Bilirubin: 0.4 mg/dL (calc) (ref 0.2–1.2)
Total Bilirubin: 0.5 mg/dL (ref 0.2–1.2)
Total Protein: 6.5 g/dL (ref 6.1–8.1)

## 2020-02-09 LAB — TSH: TSH: 0.94 mIU/L (ref 0.40–4.50)

## 2020-02-11 ENCOUNTER — Other Ambulatory Visit: Payer: Self-pay

## 2020-02-11 ENCOUNTER — Ambulatory Visit: Payer: 59 | Admitting: Internal Medicine

## 2020-02-11 VITALS — BP 102/70 | HR 73 | Ht 65.0 in | Wt 191.0 lb

## 2020-02-11 DIAGNOSIS — E039 Hypothyroidism, unspecified: Secondary | ICD-10-CM

## 2020-02-11 DIAGNOSIS — G4709 Other insomnia: Secondary | ICD-10-CM

## 2020-02-11 DIAGNOSIS — R748 Abnormal levels of other serum enzymes: Secondary | ICD-10-CM

## 2020-02-11 DIAGNOSIS — F329 Major depressive disorder, single episode, unspecified: Secondary | ICD-10-CM

## 2020-02-11 DIAGNOSIS — I10 Essential (primary) hypertension: Secondary | ICD-10-CM | POA: Diagnosis not present

## 2020-02-11 DIAGNOSIS — F419 Anxiety disorder, unspecified: Secondary | ICD-10-CM

## 2020-02-11 DIAGNOSIS — Z23 Encounter for immunization: Secondary | ICD-10-CM | POA: Diagnosis not present

## 2020-02-11 DIAGNOSIS — F32A Depression, unspecified: Secondary | ICD-10-CM

## 2020-02-11 NOTE — Progress Notes (Signed)
   Subjective:    Patient ID: Diana House, female    DOB: 06-05-1956, 64 y.o.   MRN: [id number]  HPI Patient in today for 77-month follow-up.  Tdap vaccine given today.  She has a history of elevated liver enzymes evaluated by Dr. Earlean Shawl.  These are once again mildly elevated at 36 and 53.  A year ago in July 2020 SGOT was 45 and SGPT 54.  This is about the same overall as last year.  She had ultrasound of the abdomen regarding this issue in 2014.  There was suggestion of fatty liver infiltration.  Otherwise study was negative.  She has seen Dr. Earlean Shawl in 2015.  His family was transaminitis was likely due to alcoholic hepatic steatosis.  She continues to drink wine.  Weight is 191 pounds.  She has gained 10 pounds since July 2020.  This could be aggravating her liver functions.    Review of Systems see above-basically feels well and is enjoying retirement.     Objective:   Physical Exam Blood pressure 102/70 pulse 73 pulse oximetry 97% weight 191 pounds height 5 feet 0 inches BMI 31.78.  No thyromegaly.  Chest clear.  Cardiac exam regular rate and rhythm.  Abdomen soft nondistended without hepatosplenomegaly, masses or tenderness.  No lower extremity pitting edema.  Affect thought and judgment are normal.       Assessment & Plan:  Hypothyroidism-TSH excellent at 0.94-continue same dose of thyroid replacement therapy  10 pound weight gain which could be aggravating elevated liver function/fatty liver.  Encourage diet exercise and weight loss  Elevated liver functions-previously evaluated carefully by Dr. Earlean Shawl in follow-up these were caused by a combination of fatty liver and alcohol consumption.  These are about the same as previous values and we will continue to monitor at this point encourage diet exercise and weight loss.  Watch alcohol consumption.  She will follow up in 3 months with liver panel and office visit.  Health maintenance-Tdap vaccine given today and she has had two  COVID-19 immunizations in December and January  Hypertension-blood pressure stable on HCTZ 25 mg daily and metoprolol 25 mg daily.  History of anxiety depression treated with Lexapro 10 mg daily and Wellbutrin XL 300 mg daily.  Insomnia treated with Ambien 10 mg at bedtime if needed  Follow-up in 3 months with office visit and liver functions.  Cut back on alcohol consumption.  Try to lose weight.

## 2020-02-11 NOTE — Patient Instructions (Addendum)
Tdap given today. RTC in 3 months. Continue current meds. Work on diet exercise and weight loss.  Cut back on alcohol consumption.

## 2020-02-13 ENCOUNTER — Encounter: Payer: Self-pay | Admitting: Internal Medicine

## 2020-02-25 LAB — HM MAMMOGRAPHY

## 2020-03-20 ENCOUNTER — Other Ambulatory Visit: Payer: Self-pay | Admitting: Internal Medicine

## 2020-05-17 ENCOUNTER — Other Ambulatory Visit: Payer: 59 | Admitting: Internal Medicine

## 2020-05-17 ENCOUNTER — Other Ambulatory Visit: Payer: Self-pay

## 2020-05-17 DIAGNOSIS — R748 Abnormal levels of other serum enzymes: Secondary | ICD-10-CM

## 2020-05-19 ENCOUNTER — Other Ambulatory Visit: Payer: Self-pay

## 2020-05-19 ENCOUNTER — Encounter: Payer: Self-pay | Admitting: Internal Medicine

## 2020-05-19 ENCOUNTER — Ambulatory Visit: Payer: 59 | Admitting: Internal Medicine

## 2020-05-19 VITALS — BP 120/80 | HR 83 | Ht 65.0 in | Wt 186.0 lb

## 2020-05-19 DIAGNOSIS — G4709 Other insomnia: Secondary | ICD-10-CM | POA: Diagnosis not present

## 2020-05-19 DIAGNOSIS — I10 Essential (primary) hypertension: Secondary | ICD-10-CM

## 2020-05-19 DIAGNOSIS — E039 Hypothyroidism, unspecified: Secondary | ICD-10-CM

## 2020-05-19 DIAGNOSIS — R7989 Other specified abnormal findings of blood chemistry: Secondary | ICD-10-CM | POA: Diagnosis not present

## 2020-05-19 DIAGNOSIS — F3289 Other specified depressive episodes: Secondary | ICD-10-CM

## 2020-05-20 LAB — HEPATIC FUNCTION PANEL
AG Ratio: 1.8 (calc) (ref 1.0–2.5)
ALT: 28 U/L (ref 6–29)
AST: 24 U/L (ref 10–35)
Albumin: 4.2 g/dL (ref 3.6–5.1)
Alkaline phosphatase (APISO): 81 U/L (ref 37–153)
Bilirubin, Direct: 0.1 mg/dL (ref 0.0–0.2)
Globulin: 2.4 g/dL (calc) (ref 1.9–3.7)
Indirect Bilirubin: 0.4 mg/dL (calc) (ref 0.2–1.2)
Total Bilirubin: 0.5 mg/dL (ref 0.2–1.2)
Total Protein: 6.6 g/dL (ref 6.1–8.1)

## 2020-05-20 LAB — TSH: TSH: 0.12 mIU/L — ABNORMAL LOW (ref 0.40–4.50)

## 2020-05-20 LAB — TEST AUTHORIZATION

## 2020-05-23 ENCOUNTER — Other Ambulatory Visit: Payer: Self-pay

## 2020-05-23 MED ORDER — LEVOTHYROXINE SODIUM 125 MCG PO TABS
125.0000 ug | ORAL_TABLET | Freq: Every day | ORAL | 0 refills | Status: DC
Start: 2020-05-23 — End: 2020-09-29

## 2020-05-26 ENCOUNTER — Other Ambulatory Visit: Payer: Self-pay | Admitting: Internal Medicine

## 2020-06-08 ENCOUNTER — Other Ambulatory Visit: Payer: Self-pay | Admitting: Internal Medicine

## 2020-06-12 ENCOUNTER — Encounter: Payer: Self-pay | Admitting: Internal Medicine

## 2020-06-12 NOTE — Progress Notes (Signed)
   Subjective:    Patient ID: Diana House, female    DOB: Jan 31, 1956, 64 y.o.   MRN: [id number]  HPI 64 year old Female seen today for 14-month follow-up. History of hypertension and hypothyroidism. Has had some situational stress with mother who had hip fracture recently. She currently is in nursing/rehab facility.   She has had 3 COVID-19 immunizations. Tetanus immunization is up-to-date. Received flu vaccine on October 21.  Review of Systems no new complaints     Objective:   Physical Exam  Blood pressure 120/80 pulse 83 pulse oximetry 96% weight 186 pounds BMI 30.95  Skin warm and dry. No cervical adenopathy. No thyromegaly. Chest is clear to auscultation. Cardiac exam regular rate and rhythm normal S1 and S2. No lower extremity pitting edema.      Assessment & Plan:  Essential hypertension-stable on metoprolol and HCTZ  Situational stress with mother  History of depression treated with Lexapro and Wellbutrin  Insomnia treated with Ambien  Hypothyroidism-TSH is low at 0.12. Reduce levothyroxine from 150 mcg to 125 mcg daily and follow-up in December. Physical exam booked for May 2022.  Plan: Continue current regimen and follow-up

## 2020-06-12 NOTE — Patient Instructions (Addendum)
Reduce levothyroxine to 125 mcg daily and follow-up in early December. Continue current antihypertensive medications. Continue antidepressant medications.

## 2020-06-24 ENCOUNTER — Other Ambulatory Visit: Payer: Self-pay | Admitting: Internal Medicine

## 2020-06-28 ENCOUNTER — Other Ambulatory Visit: Payer: Self-pay

## 2020-06-28 ENCOUNTER — Other Ambulatory Visit: Payer: 59 | Admitting: Internal Medicine

## 2020-06-28 DIAGNOSIS — R7989 Other specified abnormal findings of blood chemistry: Secondary | ICD-10-CM

## 2020-06-28 DIAGNOSIS — E039 Hypothyroidism, unspecified: Secondary | ICD-10-CM

## 2020-06-29 LAB — TSH: TSH: 0.56 mIU/L (ref 0.40–4.50)

## 2020-06-29 NOTE — Progress Notes (Signed)
Continue current dose of Synthroid. TSH is normal.

## 2020-06-30 ENCOUNTER — Other Ambulatory Visit: Payer: Self-pay

## 2020-06-30 ENCOUNTER — Encounter: Payer: Self-pay | Admitting: Internal Medicine

## 2020-06-30 ENCOUNTER — Ambulatory Visit (INDEPENDENT_AMBULATORY_CARE_PROVIDER_SITE_OTHER): Payer: 59 | Admitting: Internal Medicine

## 2020-06-30 VITALS — BP 106/60 | Ht 65.0 in | Wt 182.0 lb

## 2020-06-30 DIAGNOSIS — I10 Essential (primary) hypertension: Secondary | ICD-10-CM | POA: Diagnosis not present

## 2020-06-30 DIAGNOSIS — G4709 Other insomnia: Secondary | ICD-10-CM

## 2020-06-30 DIAGNOSIS — F419 Anxiety disorder, unspecified: Secondary | ICD-10-CM | POA: Diagnosis not present

## 2020-06-30 DIAGNOSIS — E063 Autoimmune thyroiditis: Secondary | ICD-10-CM | POA: Diagnosis not present

## 2020-06-30 DIAGNOSIS — F32A Depression, unspecified: Secondary | ICD-10-CM

## 2020-06-30 MED ORDER — LEVOTHYROXINE SODIUM 125 MCG PO TABS: 125.0000 ug | ORAL_TABLET | Freq: Every day | ORAL | 1 refills | Status: DC

## 2020-06-30 NOTE — Progress Notes (Signed)
   Subjective:    Patient ID: Diana House, female    DOB: 04-19-1956, 64 y.o.   MRN: [id number]  HPI 64 year old Female seen for follow-up on hypothyroidism.  Currently on levothyroxine 0.125 mg daily.  TSH is normal at 0.56.  TSH had been low in November at 0.12 at which time levothyroxine was decreased from 150 mcg to 125 mcg daily.  She was also seen at that time for essential hypertension which was noted to be stable on metoprolol and HCTZ.  She has depression treated with Lexapro and Wellbutrin.  She has insomnia treated with Ambien.  Mother is in assisted living facility status post hip fracture.  Patient says it is unlikely her mother who is also a patient here will return to her home in [place].    Review of Systems see above-no new complaints     Objective:   Physical Exam Blood pressure 106/60, weight 182 pounds, height 5 feet 5 inches BMI 30.29  She has no thyromegaly.  Blood pressure is stable.  Affect is normal.       Assessment & Plan:  Essential hypertension-stable on current regimen of metoprolol and HCTZ  Anxiety depression-stable on Lexapro and Wellbutrin  Insomnia treated with Ambien 10 mg at bedtime.  Hypothyroidism-low TSH has resolved with adjustment in thyroid replacement medication dosage.  TSH is now normal on levothyroxine 0. 125 mg daily.  Mail-order prescription sent today.  She will return in late February for health maintenance exam and fasting labs.  She has had 3 Covid immunizations and had influenza immunization in October.

## 2020-06-30 NOTE — Patient Instructions (Signed)
It was a pleasure to see you today.  Your immunizations are up-to-date.  TSH is normal on levothyroxine 0.125 mg daily and mail-order prescription has been sent for you today.  Return in late February 2022 for health maintenance exam and fasting labs.

## 2020-07-16 ENCOUNTER — Other Ambulatory Visit: Payer: Self-pay | Admitting: Internal Medicine

## 2020-08-17 ENCOUNTER — Other Ambulatory Visit: Payer: Self-pay | Admitting: Internal Medicine

## 2020-08-26 ENCOUNTER — Encounter: Payer: Self-pay | Admitting: Internal Medicine

## 2020-09-01 ENCOUNTER — Encounter: Payer: 59 | Admitting: Diagnostic Neuroimaging

## 2020-09-01 ENCOUNTER — Ambulatory Visit (INDEPENDENT_AMBULATORY_CARE_PROVIDER_SITE_OTHER): Payer: 59 | Admitting: Diagnostic Neuroimaging

## 2020-09-01 DIAGNOSIS — R200 Anesthesia of skin: Secondary | ICD-10-CM | POA: Diagnosis not present

## 2020-09-01 DIAGNOSIS — Z0289 Encounter for other administrative examinations: Secondary | ICD-10-CM

## 2020-09-01 NOTE — Procedures (Signed)
[facility]  NCS (NERVE CONDUCTION STUDY) WITH EMG (ELECTROMYOGRAPHY) REPORT   STUDY DATE: 09/01/20 PATIENT NAME: Diana House DOB: 1956-05-21 MRN: [id number]  ORDERING CLINICIAN: Vickie Epley, MD  TECHNOLOGIST: Sherre Scarlet ELECTROMYOGRAPHER: Earlean Polka. Celia Gibbons, MD  CLINICAL INFORMATION: 65 year old female with right arm numbness.  History of cervical spine surgery in 2000.  FINDINGS: NERVE CONDUCTION STUDY:  Bilateral median and ulnar motor responses are normal.  Bilateral median and ulnar sensory responses are normal.  Bilateral ulnar F-wave latencies are normal.   NEEDLE ELECTROMYOGRAPHY:  Needle examination of right upper extremity is normal.   IMPRESSION:   Normal study.  No electrodiagnostic evidence of large fiber neuropathy this time.    INTERPRETING PHYSICIAN:  Penni Bombard, MD Certified in Neurology, Neurophysiology and Neuroimaging  [facility] [address], [place], [place] [address] [phone]  [facility]    Nerve / Sites Muscle Latency Ref. Amplitude Ref. Rel Amp Segments Distance Velocity Ref. Area    ms ms mV mV %  cm m/s m/s mVms  L Median - APB     Wrist APB 3.2 ?4.4 10.1 ?4.0 100 Wrist - APB 7   42.0     Upper arm APB 6.9  10.1  99.8 Upper arm - Wrist 20 54 ?49 40.7  R Median - APB     Wrist APB 3.3 ?4.4 8.7 ?4.0 100 Wrist - APB 7   30.3     Upper arm APB 7.4  7.0  80.5 Upper arm - Wrist 20 49 ?49 21.3  L Ulnar - ADM     Wrist ADM 3.3 ?3.3 11.3 ?6.0 100 Wrist - ADM 7   32.7     B.Elbow ADM 6.2  9.8  87.1 B.Elbow - Wrist 18 61 ?49 28.9     A.Elbow ADM 8.0  10.7  109 A.Elbow - B.Elbow 10 56 ?49 31.0  R Ulnar - ADM     Wrist ADM 3.3 ?3.3 10.0 ?6.0 100 Wrist - ADM 7   31.9     B.Elbow ADM 6.4  8.7  86.9 B.Elbow - Wrist 20 64 ?49 29.6     A.Elbow ADM 8.1  9.0  104 A.Elbow - B.Elbow 10 59 ?49 32.0             SNC    Nerve / Sites Rec. Site Peak Lat Ref.  Amp Ref. Segments Distance    ms ms V V   cm  L Median - Orthodromic (Dig II, Mid palm)     Dig II Wrist 2.9 ?3.4 20 ?10 Dig II - Wrist 13  R Median - Orthodromic (Dig II, Mid palm)     Dig II Wrist 3.0 ?3.4 18 ?10 Dig II - Wrist 13  L Ulnar - Orthodromic, (Dig V, Mid palm)     Dig V Wrist 2.7 ?3.1 9 ?5 Dig V - Wrist 11  R Ulnar - Orthodromic, (Dig V, Mid palm)     Dig V Wrist 2.9 ?3.1 9 ?5 Dig V - Wrist 29             F  Wave    Nerve F Lat Ref.   ms ms  L Ulnar - ADM 28.8 ?32.0  R Ulnar - ADM 26.9 ?32.0         EMG Summary Table    Spontaneous MUAP Recruitment  Muscle IA Fib PSW Fasc Other Amp Dur. Poly Pattern  R. Deltoid Normal None None None  _______ Normal Normal Normal Normal  R. Biceps brachii Normal None None None _______ Normal Normal Normal Normal  R. Triceps brachii Normal None None None _______ Normal Normal Normal Normal  R. Flexor carpi radialis Normal None None None _______ Normal Normal Normal Normal  R. First dorsal interosseous Normal None None None _______ Normal Normal Normal Normal

## 2020-09-06 ENCOUNTER — Other Ambulatory Visit: Payer: 59 | Admitting: Internal Medicine

## 2020-09-08 ENCOUNTER — Encounter: Payer: 59 | Admitting: Internal Medicine

## 2020-09-27 ENCOUNTER — Other Ambulatory Visit: Payer: Self-pay

## 2020-09-27 ENCOUNTER — Other Ambulatory Visit: Payer: 59 | Admitting: Internal Medicine

## 2020-09-27 DIAGNOSIS — Z0000 Encounter for general adult medical examination without abnormal findings: Secondary | ICD-10-CM

## 2020-09-27 DIAGNOSIS — E781 Pure hyperglyceridemia: Secondary | ICD-10-CM

## 2020-09-27 DIAGNOSIS — I10 Essential (primary) hypertension: Secondary | ICD-10-CM

## 2020-09-27 DIAGNOSIS — N6019 Diffuse cystic mastopathy of unspecified breast: Secondary | ICD-10-CM

## 2020-09-27 DIAGNOSIS — E039 Hypothyroidism, unspecified: Secondary | ICD-10-CM

## 2020-09-27 DIAGNOSIS — G4709 Other insomnia: Secondary | ICD-10-CM

## 2020-09-27 DIAGNOSIS — F32A Depression, unspecified: Secondary | ICD-10-CM

## 2020-09-27 DIAGNOSIS — M5090 Cervical disc disorder, unspecified, unspecified cervical region: Secondary | ICD-10-CM

## 2020-09-27 DIAGNOSIS — E063 Autoimmune thyroiditis: Secondary | ICD-10-CM

## 2020-09-28 LAB — CBC WITH DIFFERENTIAL/PLATELET
Absolute Monocytes: 497 cells/uL (ref 200–950)
Basophils Absolute: 78 cells/uL (ref 0–200)
Basophils Relative: 1.1 %
Eosinophils Absolute: 419 cells/uL (ref 15–500)
Eosinophils Relative: 5.9 %
HCT: 38.2 % (ref 35.0–45.0)
Hemoglobin: 13.1 g/dL (ref 11.7–15.5)
Lymphs Abs: 2279 cells/uL (ref 850–3900)
MCH: 34.6 pg — ABNORMAL HIGH (ref 27.0–33.0)
MCHC: 34.3 g/dL (ref 32.0–36.0)
MCV: 100.8 fL — ABNORMAL HIGH (ref 80.0–100.0)
MPV: 11.5 fL (ref 7.5–12.5)
Monocytes Relative: 7 %
Neutro Abs: 3827 cells/uL (ref 1500–7800)
Neutrophils Relative %: 53.9 %
Platelets: 283 10*3/uL (ref 140–400)
RBC: 3.79 10*6/uL — ABNORMAL LOW (ref 3.80–5.10)
RDW: 11.5 % (ref 11.0–15.0)
Total Lymphocyte: 32.1 %
WBC: 7.1 10*3/uL (ref 3.8–10.8)

## 2020-09-28 LAB — COMPLETE METABOLIC PANEL WITH GFR
AG Ratio: 1.8 (calc) (ref 1.0–2.5)
ALT: 23 U/L (ref 6–29)
AST: 21 U/L (ref 10–35)
Albumin: 4.2 g/dL (ref 3.6–5.1)
Alkaline phosphatase (APISO): 72 U/L (ref 37–153)
BUN: 9 mg/dL (ref 7–25)
CO2: 31 mmol/L (ref 20–32)
Calcium: 9.3 mg/dL (ref 8.6–10.4)
Chloride: 101 mmol/L (ref 98–110)
Creat: 0.66 mg/dL (ref 0.50–0.99)
GFR, Est African American: 109 mL/min/{1.73_m2} (ref >=60)
GFR, Est Non African American: 94 mL/min/{1.73_m2} (ref >=60)
Globulin: 2.4 g/dL (calc) (ref 1.9–3.7)
Glucose, Bld: 93 mg/dL (ref 65–99)
Potassium: 4 mmol/L (ref 3.5–5.3)
Sodium: 139 mmol/L (ref 135–146)
Total Bilirubin: 0.5 mg/dL (ref 0.2–1.2)
Total Protein: 6.6 g/dL (ref 6.1–8.1)

## 2020-09-28 LAB — LIPID PANEL
Cholesterol: 170 mg/dL (ref <200)
HDL: 58 mg/dL (ref >=50)
LDL Cholesterol (Calc): 87 mg/dL (calc)
Non-HDL Cholesterol (Calc): 112 mg/dL (calc) (ref <130)
Total CHOL/HDL Ratio: 2.9 (calc) (ref <5.0)
Triglycerides: 152 mg/dL — ABNORMAL HIGH (ref <150)

## 2020-09-28 LAB — VITAMIN D 25 HYDROXY (VIT D DEFICIENCY, FRACTURES): Vit D, 25-Hydroxy: 70 ng/mL (ref 30–100)

## 2020-09-28 LAB — TSH: TSH: 0.86 mIU/L (ref 0.40–4.50)

## 2020-09-29 ENCOUNTER — Ambulatory Visit (INDEPENDENT_AMBULATORY_CARE_PROVIDER_SITE_OTHER): Payer: 59 | Admitting: Internal Medicine

## 2020-09-29 ENCOUNTER — Other Ambulatory Visit: Payer: Self-pay

## 2020-09-29 ENCOUNTER — Telehealth: Payer: Self-pay | Admitting: Internal Medicine

## 2020-09-29 ENCOUNTER — Encounter: Payer: Self-pay | Admitting: Internal Medicine

## 2020-09-29 VITALS — BP 102/70 | HR 60 | Ht 65.0 in | Wt 182.0 lb

## 2020-09-29 DIAGNOSIS — I10 Essential (primary) hypertension: Secondary | ICD-10-CM

## 2020-09-29 DIAGNOSIS — Z0000 Encounter for general adult medical examination without abnormal findings: Secondary | ICD-10-CM

## 2020-09-29 DIAGNOSIS — F419 Anxiety disorder, unspecified: Secondary | ICD-10-CM

## 2020-09-29 DIAGNOSIS — L309 Dermatitis, unspecified: Secondary | ICD-10-CM | POA: Diagnosis not present

## 2020-09-29 DIAGNOSIS — M5090 Cervical disc disorder, unspecified, unspecified cervical region: Secondary | ICD-10-CM

## 2020-09-29 DIAGNOSIS — F32A Depression, unspecified: Secondary | ICD-10-CM

## 2020-09-29 DIAGNOSIS — G4709 Other insomnia: Secondary | ICD-10-CM

## 2020-09-29 DIAGNOSIS — Z85820 Personal history of malignant melanoma of skin: Secondary | ICD-10-CM

## 2020-09-29 DIAGNOSIS — E063 Autoimmune thyroiditis: Secondary | ICD-10-CM

## 2020-09-29 LAB — POCT URINALYSIS DIPSTICK
Appearance: NEGATIVE
Bilirubin, UA: NEGATIVE
Blood, UA: NEGATIVE
Glucose, UA: NEGATIVE
Ketones, UA: NEGATIVE
Leukocytes, UA: NEGATIVE
Nitrite, UA: NEGATIVE
Odor: NEGATIVE
Protein, UA: NEGATIVE
Spec Grav, UA: 1.01 (ref 1.010–1.025)
Urobilinogen, UA: 0.2 E.U./dL
pH, UA: 6.5 (ref 5.0–8.0)

## 2020-09-29 MED ORDER — METHYLPREDNISOLONE ACETATE 80 MG/ML IJ SUSP
80.0000 mg | Freq: Once | INTRAMUSCULAR | Status: AC
  Administered 2020-09-29: 80 mg via INTRAMUSCULAR

## 2020-09-29 NOTE — Telephone Encounter (Signed)
Faxed requested medical records from Dr Dellis Filbert [facility] office [phone], phone [phone] Office notes, colonoscopies, previous procedures and also ask when next colonoscopy is due.

## 2020-09-29 NOTE — Patient Instructions (Addendum)
Depomedrol 80 mg Im for eczema. Continue same meds and RTC in 6 months for OV and TSH.  Please call Dr. Liliane Channel office to see when colonoscopy due.  Patient is cleared for upcoming cervical disc surgery.

## 2020-09-29 NOTE — Progress Notes (Signed)
   Subjective:    Patient ID: Diana House, female    DOB: 17-Feb-1956, 65 y.o.   MRN: [id number]  HPI 65 year old Female seen for health maintenance exam and evaluation of medical issues.   Scheduled for surgery with Dr. Vertell Limber in the near future for cervical spondylolisthesis with radiculopathy. Has herniated disc at C4-5 and C-7 - T-1.  History of cervical disc disease status post surgery August 2000.  Had cervical disc surgery at C5-C6 and C6-C7.  Also had lumbar microdiscectomy L5-S1 January 2018 by Dr. Vertell Limber.  History of essential hypertension, hypothyroidism, GE reflux, vitamin D deficiency and depression.  History of fibrocystic breast disease followed by mammography.  Intolerant of Biaxin.  Had brachial plexus surgery 1998.  Melanoma removed from her back in 2005.  Surgery for strabismus at age 42 and at age 42.  Tonsillectomy in the remote past.  Uterine ablation by Dr. Nori Riis in 2005.  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  3 adult children, 2 sons and a daughter.  Social alcohol consumption.  Married.  Family history: Mother with history of hypertension, hyperlipidemia, hypothyroidism, heart failure, chronic anticoagulation for DVT living at age 51.  Father with history of coronary artery disease, diabetes, CABG and hypertension.  3 brothers and 1 sister living.      Review of Systems see above-having pain from cervical radiculopathy     Objective:   Physical Exam Blood pressure 102/70 pulse 60 pulse oximetry 98% weight 192 pounds height 5 feet 5 inches BMI 30.2  Skin: Warm and dry.  No worrisome skin lesions.  No cervical adenopathy.  No thyromegaly.  No carotid bruits.  TMs are clear.  Chest is clear to auscultation.  Cardiac exam: Regular rate and rhythm normal S1 and S2.  Abdomen is soft nondistended without hepatosplenomegaly masses or tenderness.  Breasts are without masses.  No lower extremity  pitting edema.  Cranial nerves II through XII are grossly intact.  Affect thought and judgment are normal.       Assessment & Plan:  Cervical radiculopathy with multilevel surgery to be done by Dr. Vertell Limber in the near future at [facility] with overnight stay.  Currently on Zanaflex  Hypothyroidism-TSH is normal on current dose of thyroid replacement  History of depression stable with SSRI and Wellbutrin  Remote history of melanoma on her back with no recurrence or other lesions of concern.  Essential hypertension treated with HCTZ and metoprolol and stable  GE reflux treated with Prilosec and stable  History of vitamin D deficiency-takes vitamin D supplement.  Level checked and was 70.  Estrogen replacement per GYN Dr. Nori Riis.  Also is on Prometrium.  Longstanding history of insomnia treated with Ambien 10 mg at bedtime.  History of eczema treated with triamcinolone cream topically.  This is bothering her quite a bit today we gave her an injection of Depo-Medrol 80 mg IM.  Plan: Patient is cleared for surgery.  Return in 6 months for follow-up on hypothyroidism, depression, and hypertension.  Continue vitamin D supplement.  Asked patient to call Dr. Liliane Channel office to see when his next colonoscopy is due.

## 2020-09-29 NOTE — Telephone Encounter (Signed)
Faxed Medical Records request to Dr Evette Cristal [phone], phone [phone]  Last couple Office visits, pap smears, mammograms

## 2020-10-05 ENCOUNTER — Telehealth: Payer: Self-pay

## 2020-10-05 NOTE — Telephone Encounter (Signed)
Called patient to let her know that she needs to schedule a repeat colonoscopy last was 2009.

## 2020-10-06 ENCOUNTER — Encounter: Payer: Self-pay | Admitting: Internal Medicine

## 2020-10-07 ENCOUNTER — Other Ambulatory Visit: Payer: Self-pay | Admitting: Internal Medicine

## 2020-10-10 MED ORDER — METHYLPREDNISOLONE ACETATE 80 MG/ML IJ SUSP
80.0000 mg | Freq: Once | INTRAMUSCULAR | Status: AC
  Administered 2020-09-29: 80 mg via INTRAMUSCULAR

## 2020-10-10 NOTE — Addendum Note (Signed)
Addended by: Mady Haagensen on: 10/10/2020 04:19 PM   Modules accepted: Orders

## 2020-10-13 NOTE — Telephone Encounter (Signed)
Received letter back from Dr [facility] office that they would not forward any records to our office without patient signing release form. So Patient will need to contact them or sign form next time she is here.

## 2020-11-15 ENCOUNTER — Other Ambulatory Visit: Payer: 59 | Admitting: Internal Medicine

## 2020-11-17 ENCOUNTER — Encounter: Payer: 59 | Admitting: Internal Medicine

## 2020-11-23 ENCOUNTER — Other Ambulatory Visit: Payer: Self-pay | Admitting: Internal Medicine

## 2020-11-29 ENCOUNTER — Other Ambulatory Visit: Payer: Self-pay | Admitting: Physical Medicine and Rehabilitation

## 2020-11-29 DIAGNOSIS — Z981 Arthrodesis status: Secondary | ICD-10-CM

## 2020-11-29 DIAGNOSIS — M5416 Radiculopathy, lumbar region: Secondary | ICD-10-CM

## 2020-11-30 ENCOUNTER — Ambulatory Visit
Admission: RE | Admit: 2020-11-30 | Discharge: 2020-11-30 | Disposition: A | Discharge status: Home / Self Care | Payer: 59 | Source: Ambulatory Visit | Attending: Physical Medicine and Rehabilitation | Admitting: Physical Medicine and Rehabilitation

## 2020-11-30 DIAGNOSIS — Z981 Arthrodesis status: Secondary | ICD-10-CM

## 2020-11-30 DIAGNOSIS — M5416 Radiculopathy, lumbar region: Secondary | ICD-10-CM

## 2020-11-30 MED ORDER — GADOBENATE DIMEGLUMINE 529 MG/ML IV SOLN
16.0000 mL | Freq: Once | INTRAVENOUS | Status: AC | PRN
  Administered 2020-11-30: 16 mL via INTRAVENOUS

## 2021-02-13 ENCOUNTER — Other Ambulatory Visit: Payer: Self-pay | Admitting: Internal Medicine

## 2021-04-04 ENCOUNTER — Other Ambulatory Visit: Payer: 59 | Admitting: Internal Medicine

## 2021-04-04 ENCOUNTER — Other Ambulatory Visit: Payer: Self-pay

## 2021-04-04 DIAGNOSIS — E039 Hypothyroidism, unspecified: Secondary | ICD-10-CM

## 2021-04-04 DIAGNOSIS — R748 Abnormal levels of other serum enzymes: Secondary | ICD-10-CM

## 2021-04-04 DIAGNOSIS — R7989 Other specified abnormal findings of blood chemistry: Secondary | ICD-10-CM

## 2021-04-05 LAB — HEPATIC FUNCTION PANEL
AG Ratio: 1.4 (calc) (ref 1.0–2.5)
ALT: 26 U/L (ref 6–29)
AST: 22 U/L (ref 10–35)
Albumin: 4 g/dL (ref 3.6–5.1)
Alkaline phosphatase (APISO): 78 U/L (ref 37–153)
Bilirubin, Direct: 0.1 mg/dL (ref 0.0–0.2)
Globulin: 2.8 g/dL (calc) (ref 1.9–3.7)
Indirect Bilirubin: 0.3 mg/dL (calc) (ref 0.2–1.2)
Total Bilirubin: 0.4 mg/dL (ref 0.2–1.2)
Total Protein: 6.8 g/dL (ref 6.1–8.1)

## 2021-04-05 LAB — TSH: TSH: 0.96 mIU/L (ref 0.40–4.50)

## 2021-04-06 ENCOUNTER — Ambulatory Visit: Payer: 59 | Admitting: Internal Medicine

## 2021-04-06 ENCOUNTER — Encounter: Payer: Self-pay | Admitting: Internal Medicine

## 2021-04-06 ENCOUNTER — Other Ambulatory Visit: Payer: Self-pay

## 2021-04-06 VITALS — BP 122/72 | HR 62 | Resp 18 | Ht 65.0 in | Wt 181.0 lb

## 2021-04-06 DIAGNOSIS — F419 Anxiety disorder, unspecified: Secondary | ICD-10-CM | POA: Diagnosis not present

## 2021-04-06 DIAGNOSIS — M5090 Cervical disc disorder, unspecified, unspecified cervical region: Secondary | ICD-10-CM | POA: Diagnosis not present

## 2021-04-06 DIAGNOSIS — F32A Depression, unspecified: Secondary | ICD-10-CM

## 2021-04-06 DIAGNOSIS — I10 Essential (primary) hypertension: Secondary | ICD-10-CM

## 2021-04-06 DIAGNOSIS — E063 Autoimmune thyroiditis: Secondary | ICD-10-CM

## 2021-04-06 MED ORDER — ONDANSETRON HCL 4 MG PO TABS: 4.0000 mg | ORAL_TABLET | Freq: Three times a day (TID) | ORAL | 2 refills | Status: DC | PRN

## 2021-04-06 NOTE — Patient Instructions (Addendum)
Blood pressure is stable on current regimen.  TSH is within normal limits on thyroid replacement medication at current dose.  Continue current medications for insomnia, anxiety and depression.  Consider colonoscopy.  Return in 1 year or as needed.  Have COVID booster and flu vaccine.  Zofran prescription at patient request.

## 2021-04-06 NOTE — Progress Notes (Signed)
   Subjective:    Patient ID: Diana House, female    DOB: 1956-04-16, 65 y.o.   MRN: [id number]  HPI Pleasant 65 year old Female seen for follow up on hypothyroidism.  TSH is normal on levothyroxine 0.125 mg daily.  She feels well.  No new complaints.  Had health maintenance exam in March.  History of cervical disc disease and spondylolisthesis with radiculopathy.  Cervical spine surgery by Dr. Vertell Limber April 2022 ACDF C4-C5 C7-T1 with removal of previously placed plate resulted in significant improvement of symptoms.  Mother was critically ill in the ICU with C. difficile infection but has improved.  Children are doing well.  Patient would like to have some Zofran on hand for nausea if needed and this was provided.  Blood pressure is stable at 122/72 HCTZ and metoprolol.  She takes Wellbutrin and Lexapro.  Has Ambien if needed for sleep.      Review of Systems no new complaints     Objective:   Physical Exam Blood pressure 122/72 pulse 62 regular respiratory rate 18 pulse oximetry 98% weight 181 pounds BMI 30.12  Skin: Warm and dry.  No thyromegaly.  No carotid bruits.  Chest clear.  Cardiac exam regular rate and rhythm.       Assessment & Plan:  Essential hypertension-stable on HCTZ and metoprolol  Hypothyroidism-normal TSH on current dose of thyroid replacement  Anxiety and depression treated with Wellbutrin and Lexapro  Insomnia- has Ambien on hand for insomnia  Health maintenance-colonoscopy done by Dr. Earlean Shawl in 2009.  5 to 10-year follow-up recommended.  Patient needs to consider repeat study.  Health maintenance today she has had 3 COVID immunizations and should get booster this fall.  Recommend Flu vaccine.  Plan: Return in 6 months or as needed.

## 2021-04-28 ENCOUNTER — Other Ambulatory Visit: Payer: Self-pay | Admitting: Internal Medicine

## 2021-05-25 ENCOUNTER — Other Ambulatory Visit: Payer: Self-pay | Admitting: Physical Medicine and Rehabilitation

## 2021-05-25 ENCOUNTER — Ambulatory Visit
Admission: RE | Admit: 2021-05-25 | Discharge: 2021-05-25 | Disposition: A | Discharge status: Home / Self Care | Payer: 59 | Source: Ambulatory Visit | Attending: Physical Medicine and Rehabilitation | Admitting: Physical Medicine and Rehabilitation

## 2021-05-25 ENCOUNTER — Other Ambulatory Visit: Payer: Self-pay

## 2021-05-25 DIAGNOSIS — M549 Dorsalgia, unspecified: Secondary | ICD-10-CM

## 2021-07-22 ENCOUNTER — Other Ambulatory Visit: Payer: Self-pay | Admitting: Internal Medicine

## 2021-08-07 LAB — COLOGUARD

## 2021-08-16 ENCOUNTER — Other Ambulatory Visit: Payer: Self-pay | Admitting: Internal Medicine

## 2021-08-26 LAB — COLOGUARD: COLOGUARD: NEGATIVE

## 2021-09-08 ENCOUNTER — Other Ambulatory Visit: Payer: Self-pay | Admitting: Internal Medicine

## 2021-09-10 LAB — COLOGUARD: Cologuard: NEGATIVE

## 2021-10-03 ENCOUNTER — Other Ambulatory Visit: Payer: Self-pay

## 2021-10-03 ENCOUNTER — Other Ambulatory Visit: Payer: 59

## 2021-10-03 DIAGNOSIS — E063 Autoimmune thyroiditis: Secondary | ICD-10-CM

## 2021-10-03 DIAGNOSIS — R718 Other abnormality of red blood cells: Secondary | ICD-10-CM

## 2021-10-03 DIAGNOSIS — I10 Essential (primary) hypertension: Secondary | ICD-10-CM

## 2021-10-03 DIAGNOSIS — Z136 Encounter for screening for cardiovascular disorders: Secondary | ICD-10-CM

## 2021-10-05 ENCOUNTER — Encounter: Payer: Self-pay | Admitting: Internal Medicine

## 2021-10-05 ENCOUNTER — Other Ambulatory Visit: Payer: Self-pay

## 2021-10-05 ENCOUNTER — Ambulatory Visit (INDEPENDENT_AMBULATORY_CARE_PROVIDER_SITE_OTHER): Payer: 59 | Admitting: Internal Medicine

## 2021-10-05 VITALS — BP 112/68 | HR 65 | Temp 97.5°F | Ht 65.25 in | Wt 187.8 lb

## 2021-10-05 DIAGNOSIS — F419 Anxiety disorder, unspecified: Secondary | ICD-10-CM | POA: Diagnosis not present

## 2021-10-05 DIAGNOSIS — F32A Depression, unspecified: Secondary | ICD-10-CM

## 2021-10-05 DIAGNOSIS — I10 Essential (primary) hypertension: Secondary | ICD-10-CM | POA: Diagnosis not present

## 2021-10-05 DIAGNOSIS — Z85820 Personal history of malignant melanoma of skin: Secondary | ICD-10-CM

## 2021-10-05 DIAGNOSIS — Z0000 Encounter for general adult medical examination without abnormal findings: Secondary | ICD-10-CM

## 2021-10-05 DIAGNOSIS — R7989 Other specified abnormal findings of blood chemistry: Secondary | ICD-10-CM | POA: Diagnosis not present

## 2021-10-05 DIAGNOSIS — E039 Hypothyroidism, unspecified: Secondary | ICD-10-CM

## 2021-10-05 DIAGNOSIS — Z6831 Body mass index (BMI) 31.0-31.9, adult: Secondary | ICD-10-CM

## 2021-10-05 LAB — POCT URINALYSIS DIPSTICK
Bilirubin, UA: NEGATIVE
Blood, UA: NEGATIVE
Glucose, UA: NEGATIVE
Ketones, UA: NEGATIVE
Leukocytes, UA: NEGATIVE
Nitrite, UA: NEGATIVE
Protein, UA: NEGATIVE
Spec Grav, UA: 1.02 (ref 1.010–1.025)
Urobilinogen, UA: 0.2 E.U./dL
pH, UA: 5 (ref 5.0–8.0)

## 2021-10-05 MED ORDER — LEVOTHYROXINE SODIUM 137 MCG PO TABS: 137.0000 ug | ORAL_TABLET | Freq: Every day | ORAL | 0 refills | Status: DC

## 2021-10-05 NOTE — Patient Instructions (Addendum)
It was a  pleasure to see you today. Increase Synthroid to 137 mcg daily and rechcek in 6 weeks. ?

## 2021-10-05 NOTE — Addendum Note (Signed)
Addended by: Angus Seller on: 10/05/2021 01:39 PM ? ? Modules accepted: Orders ? ?

## 2021-10-05 NOTE — Progress Notes (Signed)
? ?  Subjective:  ? ? Patient ID: Diana House, female    DOB: 08/24/1955, 66 y.o.   MRN: [id number] ? ?HPI  66 year old Female seen for health maintenance exam and evaluation of medical issues.  Cologuard has been ordered by GYN, Dr. Nori Riis.  ? ? She has history of cervical disc disease and is status post surgery August 2000.  Had cervical disc surgery at C5-C6 and C6-C7.  Also had lumbar microdiscectomy L5-S1 January 2018 by Dr. Vertell Limber. ? ?  In April 2022 she had ACDF C4-5 and C7-T1 with removal of previously placed plate.  Had resolution of right hand numbness after surgery and return of hand strength. ? ?History of essential hypertension, hypothyroidism, GE reflux, vitamin D deficiency and depression. ? ?Intolerant of Biaxin. ? ?History of fibrocystic breast disease followed by mammography. ? ?Had brachial plexus surgery 1998.  Melanoma removed from her back in 2005.  Surgery for strabismus at age 25 and at age 51.  Tonsillectomy in the remote past.  Uterine ablation by Dr. Nori Riis in 2005. ? 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  3 adult children, 2 sons and a daughter.  Social alcohol consumption.  She is married. ? ?Family history: Mother with history of hypertension, hyperlipidemia, hypothyroidism, heart failure, chronic anticoagulation for DVT, history of hip fracture and leg length discrepancy living.  Father with history of coronary artery disease, diabetes, CABG and hypertension.  3 brothers and 1 sister living. ? ?Labs reviewed: TSH elevated at 5.41 on levothyroxine 125 mcg daily.  Increased to 137 mcg daily and follow-up in 6 weeks.  Triglycerides elevated at 231 fasting.  1 year ago they were nearly normal at 152.  B12 is significantly elevated at greater than 2000 pg/mL.  She should decrease her supplement.  Folate is normal.  MCV is elevated at 102 but I am not sure why given these results. ? ?Remains on HCTZ and metoprolol for hypertension. ? ?Is on Lexapro and  Wellbutrin for depression ? ?Review of Systems had GYN exam recently.  No new complaints ? ?   ?Objective:  ? Physical Exam ?Blood pressure is excellent 112/68 pulse 65 pulse oximetry 98% she is afebrile weight 187 pounds 12 ounces BMI 31 ? ?Skin: Warm and dry.  Nodes none.  No thyromegaly.  No carotid bruits.  Chest is clear to auscultation without rales or wheezing.  TMs clear.  Neck is supple.  Cardiac exam: Regular rate and rhythm without ectopy.  Abdomen soft nondistended without hepatosplenomegaly masses or tenderness.  Pelvic exam deferred to Dr. Nori Riis.  No lower extremity edema.  Neuro: Intact without gross focal deficits. ? ? ? ?   ?Assessment & Plan:  ?TSH is elevated 5.41.  Increase levothyroxine to 0.137 mg daily and follow-up in 6 weeks ? ?Essential hypertension stable on current regimen of HCTZ and low-dose metoprolol ? ?Anxiety/depression and situational stress with mother stable with Wellbutrin and Lexapro ? ?History of cervical disc disease status post several cervical disc surgeries and doing well ? ?BMI 31-work on diet exercise and weight loss ? ?Elevated serum B12-decrease supplement.  For some reason MCV is elevated although she is not B12 or folate deficient. ? ?Plan: Follow-up in May with TSH and office visit. ? ?

## 2021-10-06 LAB — CBC WITH DIFFERENTIAL/PLATELET
Absolute Monocytes: 533 cells/uL (ref 200–950)
Basophils Absolute: 73 cells/uL (ref 0–200)
Basophils Relative: 1 %
Eosinophils Absolute: 438 cells/uL (ref 15–500)
Eosinophils Relative: 6 %
HCT: 36.4 % (ref 35.0–45.0)
Hemoglobin: 12.4 g/dL (ref 11.7–15.5)
Lymphs Abs: 2533 cells/uL (ref 850–3900)
MCH: 34.7 pg — ABNORMAL HIGH (ref 27.0–33.0)
MCHC: 34.1 g/dL (ref 32.0–36.0)
MCV: 102 fL — ABNORMAL HIGH (ref 80.0–100.0)
MPV: 11.5 fL (ref 7.5–12.5)
Monocytes Relative: 7.3 %
Neutro Abs: 3723 cells/uL (ref 1500–7800)
Neutrophils Relative %: 51 %
Platelets: 282 10*3/uL (ref 140–400)
RBC: 3.57 10*6/uL — ABNORMAL LOW (ref 3.80–5.10)
RDW: 11.6 % (ref 11.0–15.0)
Total Lymphocyte: 34.7 %
WBC: 7.3 10*3/uL (ref 3.8–10.8)

## 2021-10-06 LAB — COMPLETE METABOLIC PANEL WITH GFR
AG Ratio: 1.5 (calc) (ref 1.0–2.5)
ALT: 30 U/L — ABNORMAL HIGH (ref 6–29)
AST: 24 U/L (ref 10–35)
Albumin: 4.1 g/dL (ref 3.6–5.1)
Alkaline phosphatase (APISO): 96 U/L (ref 37–153)
BUN: 13 mg/dL (ref 7–25)
CO2: 30 mmol/L (ref 20–32)
Calcium: 9.5 mg/dL (ref 8.6–10.4)
Chloride: 100 mmol/L (ref 98–110)
Creat: 0.73 mg/dL (ref 0.50–1.05)
Globulin: 2.8 g/dL (calc) (ref 1.9–3.7)
Glucose, Bld: 97 mg/dL (ref 65–99)
Potassium: 4.6 mmol/L (ref 3.5–5.3)
Sodium: 141 mmol/L (ref 135–146)
Total Bilirubin: 0.2 mg/dL (ref 0.2–1.2)
Total Protein: 6.9 g/dL (ref 6.1–8.1)
eGFR: 92 mL/min/{1.73_m2} (ref >=60)

## 2021-10-06 LAB — LIPID PANEL
Cholesterol: 160 mg/dL (ref <200)
HDL: 51 mg/dL (ref >=50)
LDL Cholesterol (Calc): 77 mg/dL (calc)
Non-HDL Cholesterol (Calc): 109 mg/dL (calc) (ref <130)
Total CHOL/HDL Ratio: 3.1 (calc) (ref <5.0)
Triglycerides: 231 mg/dL — ABNORMAL HIGH (ref <150)

## 2021-10-06 LAB — TSH: TSH: 5.41 mIU/L — ABNORMAL HIGH (ref 0.40–4.50)

## 2021-10-06 LAB — B12 AND FOLATE PANEL
Folate: 8.3 ng/mL
Vitamin B-12: >2000 pg/mL — ABNORMAL HIGH (ref 200–1100)

## 2021-11-16 ENCOUNTER — Other Ambulatory Visit: Payer: 59

## 2021-11-16 DIAGNOSIS — E039 Hypothyroidism, unspecified: Secondary | ICD-10-CM

## 2021-11-17 LAB — TSH: TSH: 3.38 mIU/L (ref 0.40–4.50)

## 2021-11-21 ENCOUNTER — Ambulatory Visit: Payer: 59 | Admitting: Internal Medicine

## 2021-11-21 ENCOUNTER — Encounter: Payer: Self-pay | Admitting: Internal Medicine

## 2021-11-21 ENCOUNTER — Other Ambulatory Visit: Payer: Self-pay

## 2021-11-21 VITALS — BP 100/62 | HR 67 | Temp 97.2°F | Ht 65.25 in | Wt 185.5 lb

## 2021-11-21 DIAGNOSIS — I10 Essential (primary) hypertension: Secondary | ICD-10-CM | POA: Diagnosis not present

## 2021-11-21 DIAGNOSIS — E039 Hypothyroidism, unspecified: Secondary | ICD-10-CM

## 2021-11-21 MED ORDER — SYNTHROID 150 MCG PO TABS: 150.0000 ug | ORAL_TABLET | Freq: Every day | ORAL | 1 refills | Status: DC

## 2021-11-21 MED ORDER — PROMETHAZINE HCL 25 MG PO TABS: 25.0000 mg | ORAL_TABLET | Freq: Three times a day (TID) | ORAL | 1 refills | Status: AC | PRN

## 2021-11-21 MED ORDER — SYNTHROID 150 MCG PO TABS: 150.0000 ug | ORAL_TABLET | Freq: Every day | ORAL | 0 refills | Status: DC

## 2021-11-21 NOTE — Progress Notes (Signed)
     Subjective:    Patient ID: Diana House, female    DOB: 11-03-1955, 66 y.o.   MRN: [id number]  HPI 66 year old Female seen in March for health maintenance exam.  At that time her TSH was elevated at 5.41.  She had been compliant with thyroid replacement medication dosages.  Therefore, I increase Synthroid to 0.137 mg daily.  She is now here for follow-up.  Her TSH is now 3.38.  She is taking the medication on an empty stomach.  She feels well.  She has a history of essential hypertension, hypothyroidism, GE reflux, vitamin D deficiency and depression.  History of cervical disc disease status post surgeries by Dr. Vertell Limber  Review of Systems no new complaints     Objective:   Physical Exam Blood pressure 100/62 pulse 67 temperature 97.2 degrees pulse oximetry 97% weight 185 pounds 8 ounces BMI 30.63 No thyromegaly.  No carotid bruits.  Chest clear.  Cardiac exam regular rate and rhythm.      Assessment & Plan:  Hypothyroidism-TSH improved on increased dose of Synthroid 137 mcg daily  Essential hypertension-stable on HCTZ and metoprolol  Plan: Patient would like to go up just a bit more on thyroid replacement medication.  Have agreed to try Synthroid 150 mcg with follow-up in 6 weeks.

## 2021-12-11 NOTE — Patient Instructions (Signed)
It was a pleasure to see you today.  Continue HCTZ and metoprolol for hypertension.  TSH is improved on increased dose of Synthroid at '1 3 7 '$ mcg daily.  We will try increasing to Synthroid 150 mcg daily with follow-up in 6 weeks.

## 2022-01-02 ENCOUNTER — Other Ambulatory Visit: Payer: Medicare Other

## 2022-01-02 DIAGNOSIS — E039 Hypothyroidism, unspecified: Secondary | ICD-10-CM

## 2022-01-03 LAB — TSH: TSH: 0.55 mIU/L (ref 0.40–4.50)

## 2022-01-11 ENCOUNTER — Encounter: Payer: Self-pay | Admitting: Internal Medicine

## 2022-03-04 ENCOUNTER — Other Ambulatory Visit: Payer: Self-pay | Admitting: Internal Medicine

## 2022-04-03 ENCOUNTER — Other Ambulatory Visit: Payer: Medicare Other

## 2022-04-03 DIAGNOSIS — F419 Anxiety disorder, unspecified: Secondary | ICD-10-CM

## 2022-04-03 DIAGNOSIS — R748 Abnormal levels of other serum enzymes: Secondary | ICD-10-CM

## 2022-04-03 DIAGNOSIS — Z136 Encounter for screening for cardiovascular disorders: Secondary | ICD-10-CM

## 2022-04-03 DIAGNOSIS — E039 Hypothyroidism, unspecified: Secondary | ICD-10-CM

## 2022-04-03 DIAGNOSIS — I10 Essential (primary) hypertension: Secondary | ICD-10-CM

## 2022-04-03 DIAGNOSIS — E559 Vitamin D deficiency, unspecified: Secondary | ICD-10-CM

## 2022-04-04 LAB — TSH: TSH: 0.26 mIU/L — ABNORMAL LOW (ref 0.40–4.50)

## 2022-04-04 LAB — COMPLETE METABOLIC PANEL WITH GFR
AG Ratio: 1.6 (calc) (ref 1.0–2.5)
ALT: 44 U/L — ABNORMAL HIGH (ref 6–29)
AST: 41 U/L — ABNORMAL HIGH (ref 10–35)
Albumin: 4.5 g/dL (ref 3.6–5.1)
Alkaline phosphatase (APISO): 86 U/L (ref 37–153)
BUN: 9 mg/dL (ref 7–25)
CO2: 29 mmol/L (ref 20–32)
Calcium: 9.8 mg/dL (ref 8.6–10.4)
Chloride: 100 mmol/L (ref 98–110)
Creat: 0.59 mg/dL (ref 0.50–1.05)
Globulin: 2.8 g/dL (calc) (ref 1.9–3.7)
Glucose, Bld: 99 mg/dL (ref 65–99)
Potassium: 4.3 mmol/L (ref 3.5–5.3)
Sodium: 141 mmol/L (ref 135–146)
Total Bilirubin: 0.5 mg/dL (ref 0.2–1.2)
Total Protein: 7.3 g/dL (ref 6.1–8.1)
eGFR: 100 mL/min/{1.73_m2} (ref >=60)

## 2022-04-04 LAB — CBC WITH DIFFERENTIAL/PLATELET
Absolute Monocytes: 416 cells/uL (ref 200–950)
Basophils Absolute: 52 cells/uL (ref 0–200)
Basophils Relative: 0.8 %
Eosinophils Absolute: 299 cells/uL (ref 15–500)
Eosinophils Relative: 4.6 %
HCT: 40.1 % (ref 35.0–45.0)
Hemoglobin: 13.7 g/dL (ref 11.7–15.5)
Lymphs Abs: 1879 cells/uL (ref 850–3900)
MCH: 34.3 pg — ABNORMAL HIGH (ref 27.0–33.0)
MCHC: 34.2 g/dL (ref 32.0–36.0)
MCV: 100.5 fL — ABNORMAL HIGH (ref 80.0–100.0)
MPV: 11.3 fL (ref 7.5–12.5)
Monocytes Relative: 6.4 %
Neutro Abs: 3855 cells/uL (ref 1500–7800)
Neutrophils Relative %: 59.3 %
Platelets: 282 10*3/uL (ref 140–400)
RBC: 3.99 10*6/uL (ref 3.80–5.10)
RDW: 11.5 % (ref 11.0–15.0)
Total Lymphocyte: 28.9 %
WBC: 6.5 10*3/uL (ref 3.8–10.8)

## 2022-04-04 LAB — LIPID PANEL
Cholesterol: 190 mg/dL (ref <200)
HDL: 70 mg/dL (ref >=50)
LDL Cholesterol (Calc): 101 mg/dL (calc) — ABNORMAL HIGH
Non-HDL Cholesterol (Calc): 120 mg/dL (calc) (ref <130)
Total CHOL/HDL Ratio: 2.7 (calc) (ref <5.0)
Triglycerides: 99 mg/dL (ref <150)

## 2022-04-04 LAB — B12 AND FOLATE PANEL
Folate: 10.3 ng/mL
Vitamin B-12: 511 pg/mL (ref 200–1100)

## 2022-04-05 ENCOUNTER — Encounter: Payer: Self-pay | Admitting: Internal Medicine

## 2022-04-05 ENCOUNTER — Ambulatory Visit (INDEPENDENT_AMBULATORY_CARE_PROVIDER_SITE_OTHER): Payer: Medicare Other | Admitting: Internal Medicine

## 2022-04-05 VITALS — BP 130/82 | HR 57 | Temp 97.9°F | Ht 65.25 in | Wt 183.0 lb

## 2022-04-05 DIAGNOSIS — I10 Essential (primary) hypertension: Secondary | ICD-10-CM

## 2022-04-05 DIAGNOSIS — F439 Reaction to severe stress, unspecified: Secondary | ICD-10-CM

## 2022-04-05 DIAGNOSIS — E039 Hypothyroidism, unspecified: Secondary | ICD-10-CM

## 2022-04-05 DIAGNOSIS — F32A Depression, unspecified: Secondary | ICD-10-CM

## 2022-04-05 DIAGNOSIS — R7989 Other specified abnormal findings of blood chemistry: Secondary | ICD-10-CM

## 2022-04-05 DIAGNOSIS — F419 Anxiety disorder, unspecified: Secondary | ICD-10-CM | POA: Diagnosis not present

## 2022-04-05 MED ORDER — ALPRAZOLAM 0.25 MG PO TABS: 0.2500 mg | ORAL_TABLET | Freq: Three times a day (TID) | ORAL | 0 refills | Status: DC | PRN

## 2022-04-05 NOTE — Progress Notes (Signed)
   Subjective:    Patient ID: Diana House, female    DOB: 1956/04/08, 66 y.o.   MRN: [id number]  HPI 66 year old  Female seen for 6 month recheck. Has situational stress with elderly mother who lives in [place] with patient's brother.  She has a history of cervical disc disease and had cervical disc surgery C5-C6 and C6/C7 August 2000.  Also had lumbar microdiscectomy L5-S1 in January 2018 by Dr. Vertell Limber.  In April 2022 she had ACDF C4-C5 and C7-T1 with removal of previously placed plate.  Had resolution of right hand numbness after surgery and return of hand strength.  History of essential hypertension, hypothyroidism, GE reflux, vitamin D deficiency and depression.  History of fibrocystic breast disease.  Gets annual mammogram.  Had brachial plexus surgery in 1998.  Melanoma removed from her back in 2005.  2 surgeries for strabismus at age 59 and at age 4.  Labs reviewed include CBC with differential.  MCV slightly elevated at 100.5.  B12 level is within normal limits at 511.  Folate is normal.  Lipids excellent.  LDL is barely elevated at 101.  TSH is low at 0.26 BUN and creatinine are normal as are electrolytes.  Very mild elevation of SGOT and SGPT at 41 and 44 respectively.  She does have a glass of wine at night several nights a week.  She may want to cut back on this just a bit.  We did order ultrasound of her abdomen in 2014 for elevated liver functions.  Liver was somewhat echogenic consistent with fatty liver infiltration.    Review of Systems aside from situational stress-doing fairly well.     Objective:   Physical Exam Vital signs reviewed.  BMI is 30.22 weight 183 pounds.  Height is 5 feet 2.5 inches blood pressure 130/82 pulse 57 temperature 97.9 degrees by ear thermometer pulse oximetry 98%  Neck is supple without JVD thyromegaly or carotid bruits.  Chest clear.  Cardiac exam: Regular rate and rhythm.  No lower extremity pitting edema       Assessment & Plan:  It would  appear that her current dose of thyroid medication is a bit too high.  We are decreasing the thyroid replacement medicine to 0.1375 mg daily and following up in 6 weeks with TSH.  Patient is to take Xanax sparingly for stress and anxiety.  Watch alcohol consumption.  Follow-up here in 6 weeks.

## 2022-04-05 NOTE — Patient Instructions (Addendum)
Decrease Thyroid replacement medication to 0.137 mg daily and follow up in 6 weeks with TSH. Take Xanax sparingly for stress and anxiety.

## 2022-04-06 ENCOUNTER — Telehealth: Payer: Self-pay | Admitting: Internal Medicine

## 2022-04-06 MED ORDER — LEVOTHYROXINE SODIUM 137 MCG PO TABS: 137.0000 ug | ORAL_TABLET | Freq: Every day | ORAL | 1 refills | Status: DC

## 2022-04-06 NOTE — Telephone Encounter (Signed)
Diana House [phone]  [name] called to say she needs new prescription called in for below medication, since you guys discussed her changing the dose.  levothyroxine (SYNTHROID) 137 MCG tablet  [facility] [place], [place] Phone: [phone]  Fax: [phone]

## 2022-04-06 NOTE — Telephone Encounter (Signed)
New Rx for Synthroid 0.137 mg daily sent to [facility]. MJB,MD  #90 tabs with one refill

## 2022-05-17 ENCOUNTER — Other Ambulatory Visit: Payer: Medicare Other

## 2022-05-18 ENCOUNTER — Other Ambulatory Visit: Payer: Medicare Other

## 2022-05-18 DIAGNOSIS — E039 Hypothyroidism, unspecified: Secondary | ICD-10-CM

## 2022-05-19 LAB — TSH: TSH: 0.62 mIU/L (ref 0.40–4.50)

## 2022-05-23 ENCOUNTER — Other Ambulatory Visit: Payer: Self-pay | Admitting: Internal Medicine

## 2022-06-25 ENCOUNTER — Other Ambulatory Visit: Payer: Self-pay | Admitting: Internal Medicine

## 2022-07-07 ENCOUNTER — Other Ambulatory Visit: Payer: Self-pay | Admitting: Internal Medicine

## 2022-08-05 ENCOUNTER — Other Ambulatory Visit: Payer: Self-pay | Admitting: Internal Medicine

## 2022-08-14 ENCOUNTER — Other Ambulatory Visit: Payer: Self-pay | Admitting: Internal Medicine

## 2022-10-16 NOTE — Progress Notes (Signed)
Annual Wellness Visit    Patient Care Team: Margaree MackintoshBaxley, Kadeja Granada J, MD as PCP - General (Internal Medicine)  Visit Date: 10/23/22   Chief Complaint  Patient presents with   Medicare Wellness   Annual Exam    Subjective:   Patient: Diana House, Female    DOB: 1955-10-31, 67 y.o.   MRN: [id number]  Diana House is a 67 y.o. Female who presents today for her Annual Wellness Visit. She has a history of depression, fibrocystic breast disease, GERD, hypertension, thyroid disease, Vitamin D deficiency.  History of depression treated with Wellbutrin XL 300 mg daily, Lexapro 10 mg daily.  History of musculoskeletal pain treated with Norco 10-325 mg every 6 hours as needed.  History of hypothyroidism treated with Synthroid 137 mcg daily before breakfast. TSH at 1.39 on 10/19/22.  History of hypertension treated with Toprol-XL 25 mg daily. Blood pressure normal today at 110/76.  History of insomnia treated with Ambien 10 mg at bedtime as needed.  Glucose at 107. Liver functions elevated. TRIG, LDL elevated, HDL low. Had a flu-like virus at the time of blood work.  Pap smear last completed 02/25/20. Negative for intraepithelial lesions or malignancy. Recommended repeat in 2024.  Mammogram completed 02/25/20. No mammographic evidence of malignancy. Reports having a repeat in 10/23. Seen by Dr. Eustaquio BoydenGreywal.  Negative Cologuard 09/10/2021.  Discussed bone density scan.  Past Medical History:  Diagnosis Date   Depression    Fibrocystic breast disease    GERD (gastroesophageal reflux disease)    Hypertension    Thyroid disease    Vitamin D deficiency      Family History  Problem Relation Age of Onset   Diabetes Father    Heart disease Father    Hypertension Father    Heart disease Mother      Social History   Social History Narrative   Not on file     Review of Systems  Constitutional:  Negative for chills, fever, malaise/fatigue and weight loss.  HENT:  Positive for ear pain  (Left, pressure). Negative for hearing loss, sinus pain and sore throat.   Respiratory:  Negative for cough, hemoptysis and shortness of breath.   Cardiovascular:  Negative for chest pain, palpitations, leg swelling and PND.  Gastrointestinal:  Negative for abdominal pain, constipation, diarrhea, heartburn, nausea and vomiting.  Genitourinary:  Negative for dysuria, frequency and urgency.  Musculoskeletal:  Negative for back pain, myalgias and neck pain.  Skin:  Negative for itching and rash.  Neurological:  Negative for dizziness, tingling, seizures and headaches.  Endo/Heme/Allergies:  Negative for polydipsia.  Psychiatric/Behavioral:  Negative for depression. The patient is not nervous/anxious.       Objective:   Vitals: BP 110/76   Pulse 70   Temp 97.9 F (36.6 C) (Tympanic)   Ht 5' 5.5" (1.664 m)   Wt 183 lb 6.4 oz (83.2 kg)   SpO2 99%   BMI 30.06 kg/m   Physical Exam Vitals and nursing note reviewed.  Constitutional:      General: She is not in acute distress.    Appearance: Normal appearance. She is not ill-appearing or toxic-appearing.  HENT:     Head: Normocephalic and atraumatic.     Right Ear: Hearing, tympanic membrane, ear canal and external ear normal.     Left Ear: Hearing, ear canal and external ear normal. Tympanic membrane is erythematous.     Ears:     Comments: Left TM erythematous with splayed light  reflex.    Mouth/Throat:     Pharynx: Oropharynx is clear.  Eyes:     Extraocular Movements: Extraocular movements intact.     Pupils: Pupils are equal, round, and reactive to light.  Neck:     Thyroid: No thyroid mass, thyromegaly or thyroid tenderness.     Vascular: No carotid bruit.  Cardiovascular:     Rate and Rhythm: Normal rate and regular rhythm. No extrasystoles are present.    Pulses:          Dorsalis pedis pulses are 1+ on the right side and 1+ on the left side.     Heart sounds: Normal heart sounds. No murmur heard.    No friction rub. No  gallop.  Pulmonary:     Effort: Pulmonary effort is normal.     Breath sounds: Normal breath sounds. No decreased breath sounds, wheezing, rhonchi or rales.  Chest:     Chest wall: No mass.  Abdominal:     Palpations: Abdomen is soft. There is no hepatomegaly, splenomegaly or mass.     Tenderness: There is no abdominal tenderness.     Hernia: No hernia is present.  Musculoskeletal:     Cervical back: Normal range of motion.     Right lower leg: No edema.     Left lower leg: No edema.  Lymphadenopathy:     Cervical: No cervical adenopathy.     Upper Body:     Right upper body: No supraclavicular adenopathy.     Left upper body: No supraclavicular adenopathy.  Skin:    General: Skin is warm and dry.  Neurological:     General: No focal deficit present.     Mental Status: She is alert and oriented to person, place, and time. Mental status is at baseline.     Sensory: Sensation is intact.     Motor: Motor function is intact. No weakness.     Deep Tendon Reflexes: Reflexes are normal and symmetric.  Psychiatric:        Attention and Perception: Attention normal.        Mood and Affect: Mood normal.        Speech: Speech normal.        Behavior: Behavior normal.        Thought Content: Thought content normal.        Cognition and Memory: Cognition normal.        Judgment: Judgment normal.      Most recent functional status assessment:    10/23/2022    3:01 PM  In your present state of health, do you have any difficulty performing the following activities:  Hearing? 0  Vision? 1  Difficulty concentrating or making decisions? 0  Walking or climbing stairs? 0  Dressing or bathing? 0  Doing errands, shopping? 0  Preparing Food and eating ? N  Using the Toilet? N  In the past six months, have you accidently leaked urine? Y  Do you have problems with loss of bowel control? N  Managing your Medications? N  Managing your Finances? N  Housekeeping or managing your Housekeeping?  N   Most recent fall risk assessment:    10/23/2022    3:01 PM  Fall Risk   Falls in the past year? 0  Number falls in past yr: 0  Injury with Fall? 0  Risk for fall due to : No Fall Risks  Follow up Falls prevention discussed    Most recent depression screenings:  10/23/2022    3:01 PM 04/05/2022   11:35 AM  PHQ 2/9 Scores  PHQ - 2 Score 0 0   Most recent cognitive screening:    10/23/2022    3:02 PM  6CIT Screen  What Year? 0 points  What month? 0 points  What time? 0 points  Count back from 20 0 points  Months in reverse 0 points  Repeat phrase 0 points  Total Score 0 points     Results:   Studies obtained and personally reviewed by me:  Pap smear last completed 02/25/20. Negative for intraepithelial lesions or malignancy. Recommended repeat in 2024.  Mammogram completed 02/25/20. No mammographic evidence of malignancy. Reports having a repeat in 10/23. Seen by Dr. Eustaquio BoydenGreywal.   Labs:       Component Value Date/Time   NA 138 10/19/2022 0918   K 4.2 10/19/2022 0918   CL 94 (L) 10/19/2022 0918   CO2 33 (H) 10/19/2022 0918   GLUCOSE 107 (H) 10/19/2022 0918   BUN 9 10/19/2022 0918   CREATININE 0.69 10/19/2022 0918   CALCIUM 9.9 10/19/2022 0918   PROT 7.3 10/19/2022 0918   ALBUMIN 3.9 05/24/2016 1051   AST 51 (H) 10/19/2022 0918   ALT 102 (H) 10/19/2022 0918   ALKPHOS 70 05/24/2016 1051   BILITOT 0.3 10/19/2022 0918   GFRNONAA 94 09/27/2020 1000   GFRAA 109 09/27/2020 1000     Lab Results  Component Value Date   WBC 10.8 10/19/2022   HGB 13.5 10/19/2022   HCT 39.8 10/19/2022   MCV 99.0 10/19/2022   PLT 449 (H) 10/19/2022    Lab Results  Component Value Date   CHOL 195 10/19/2022   HDL 37 (L) 10/19/2022   LDLCALC 125 (H) 10/19/2022   TRIG 212 (H) 10/19/2022   CHOLHDL 5.3 (H) 10/19/2022    No results found for: "HGBA1C"   Lab Results  Component Value Date   TSH 1.39 10/19/2022    Assessment & Plan:   Left otitis media: prescribed  amoxicillin 500 mg 3 times daily for 10 days.  Depression: treated with Wellbutrin XL 300 mg daily, Lexapro 10 mg daily.  Musculoskeletal pain: treated with Norco 10-325 mg every 6 hours as needed.  Hypothyroidism: treated with Synthroid 137 mcg daily before breakfast. TSH at 1.39 on 10/19/22.  Hypertension: treated with Toprol-XL 25 mg daily. Blood pressure normal today at 110/76.  Insomnia: treated with Ambien 10 mg at bedtime as needed.  Pap smear: last completed 02/25/20. Negative for intraepithelial lesions or malignancy. Recommended repeat in 2024. Pelvic deferred to gynecologist.  Mammogram: completed 02/25/20. No mammographic evidence of malignancy. Reports having a repeat in 10/23. Seen by Dr. Eustaquio BoydenGreywal.  Negative Cologuard 09/10/2021. Referral for gastroenterologist.  Discussed bone density scan.  Vaccine counseling: UTD on flu, tetanus, shingles, pneumonia vaccines. Will go to pharmacy for RSV vaccine.  Return in 3 weeks for repeat CMET, lipid panel.     Annual wellness visit done today including the all of the following: Reviewed patient's Family Medical History Reviewed and updated list of patient's medical providers Assessment of cognitive impairment was done Assessed patient's functional ability Established a written schedule for health screening services Health Risk Assessent Completed and Reviewed  Discussed health benefits of physical activity, and encouraged her to engage in regular exercise appropriate for her age and condition.        I,Alexander Ruley,acting as a Neurosurgeonscribe for Margaree MackintoshMary J Kolleen Ochsner, MD.,have documented all relevant documentation on the behalf of Diana House  Kyung Rudd, MD,as directed by  Margaree Mackintosh, MD while in the presence of Margaree Mackintosh, MD.   I, Margaree Mackintosh, MD, have reviewed all documentation for this visit. The documentation on 11/10/22 for the exam, diagnosis, procedures, and orders are all accurate and complete.

## 2022-10-19 ENCOUNTER — Other Ambulatory Visit: Payer: Medicare Other

## 2022-10-19 DIAGNOSIS — E063 Autoimmune thyroiditis: Secondary | ICD-10-CM

## 2022-10-19 DIAGNOSIS — I10 Essential (primary) hypertension: Secondary | ICD-10-CM

## 2022-10-19 DIAGNOSIS — Z13220 Encounter for screening for lipoid disorders: Secondary | ICD-10-CM

## 2022-10-20 LAB — CBC WITH DIFFERENTIAL/PLATELET
Absolute Monocytes: 756 cells/uL (ref 200–950)
Basophils Absolute: 184 cells/uL (ref 0–200)
Basophils Relative: 1.7 %
Eosinophils Absolute: 443 cells/uL (ref 15–500)
Eosinophils Relative: 4.1 %
HCT: 39.8 % (ref 35.0–45.0)
Hemoglobin: 13.5 g/dL (ref 11.7–15.5)
Lymphs Abs: 2668 cells/uL (ref 850–3900)
MCH: 33.6 pg — ABNORMAL HIGH (ref 27.0–33.0)
MCHC: 33.9 g/dL (ref 32.0–36.0)
MCV: 99 fL (ref 80.0–100.0)
MPV: 10.8 fL (ref 7.5–12.5)
Monocytes Relative: 7 %
Neutro Abs: 6750 cells/uL (ref 1500–7800)
Neutrophils Relative %: 62.5 %
Platelets: 449 10*3/uL — ABNORMAL HIGH (ref 140–400)
RBC: 4.02 10*6/uL (ref 3.80–5.10)
RDW: 11.8 % (ref 11.0–15.0)
Total Lymphocyte: 24.7 %
WBC: 10.8 10*3/uL (ref 3.8–10.8)

## 2022-10-20 LAB — COMPLETE METABOLIC PANEL WITH GFR
AG Ratio: 1.3 (calc) (ref 1.0–2.5)
ALT: 102 U/L — ABNORMAL HIGH (ref 6–29)
AST: 51 U/L — ABNORMAL HIGH (ref 10–35)
Albumin: 4.1 g/dL (ref 3.6–5.1)
Alkaline phosphatase (APISO): 214 U/L — ABNORMAL HIGH (ref 37–153)
BUN: 9 mg/dL (ref 7–25)
CO2: 33 mmol/L — ABNORMAL HIGH (ref 20–32)
Calcium: 9.9 mg/dL (ref 8.6–10.4)
Chloride: 94 mmol/L — ABNORMAL LOW (ref 98–110)
Creat: 0.69 mg/dL (ref 0.50–1.05)
Globulin: 3.2 g/dL (calc) (ref 1.9–3.7)
Glucose, Bld: 107 mg/dL — ABNORMAL HIGH (ref 65–99)
Potassium: 4.2 mmol/L (ref 3.5–5.3)
Sodium: 138 mmol/L (ref 135–146)
Total Bilirubin: 0.3 mg/dL (ref 0.2–1.2)
Total Protein: 7.3 g/dL (ref 6.1–8.1)
eGFR: 96 mL/min/{1.73_m2} (ref >=60)

## 2022-10-20 LAB — LIPID PANEL
Cholesterol: 195 mg/dL (ref <200)
HDL: 37 mg/dL — ABNORMAL LOW (ref >=50)
LDL Cholesterol (Calc): 125 mg/dL (calc) — ABNORMAL HIGH
Non-HDL Cholesterol (Calc): 158 mg/dL (calc) — ABNORMAL HIGH (ref <130)
Total CHOL/HDL Ratio: 5.3 (calc) — ABNORMAL HIGH (ref <5.0)
Triglycerides: 212 mg/dL — ABNORMAL HIGH (ref <150)

## 2022-10-20 LAB — TSH: TSH: 1.39 mIU/L (ref 0.40–4.50)

## 2022-10-23 ENCOUNTER — Ambulatory Visit (INDEPENDENT_AMBULATORY_CARE_PROVIDER_SITE_OTHER): Payer: Medicare Other | Admitting: Internal Medicine

## 2022-10-23 ENCOUNTER — Encounter: Payer: Self-pay | Admitting: Internal Medicine

## 2022-10-23 VITALS — BP 110/76 | HR 70 | Temp 97.9°F | Ht 65.5 in | Wt 183.4 lb

## 2022-10-23 DIAGNOSIS — H6692 Otitis media, unspecified, left ear: Secondary | ICD-10-CM | POA: Diagnosis not present

## 2022-10-23 DIAGNOSIS — I10 Essential (primary) hypertension: Secondary | ICD-10-CM | POA: Diagnosis not present

## 2022-10-23 DIAGNOSIS — F419 Anxiety disorder, unspecified: Secondary | ICD-10-CM

## 2022-10-23 DIAGNOSIS — Z85820 Personal history of malignant melanoma of skin: Secondary | ICD-10-CM | POA: Diagnosis not present

## 2022-10-23 DIAGNOSIS — Z0000 Encounter for general adult medical examination without abnormal findings: Secondary | ICD-10-CM | POA: Diagnosis not present

## 2022-10-23 DIAGNOSIS — E039 Hypothyroidism, unspecified: Secondary | ICD-10-CM

## 2022-10-23 DIAGNOSIS — Z6830 Body mass index (BMI) 30.0-30.9, adult: Secondary | ICD-10-CM

## 2022-10-23 DIAGNOSIS — B349 Viral infection, unspecified: Secondary | ICD-10-CM

## 2022-10-23 DIAGNOSIS — F32A Depression, unspecified: Secondary | ICD-10-CM

## 2022-10-23 LAB — POCT URINALYSIS DIPSTICK
Bilirubin, UA: NEGATIVE
Blood, UA: NEGATIVE
Glucose, UA: NEGATIVE
Leukocytes, UA: NEGATIVE
Nitrite, UA: NEGATIVE
Protein, UA: NEGATIVE
Spec Grav, UA: <=1.005 (ref 1.010–1.025)
Urobilinogen, UA: 0.2 E.U./dL
pH, UA: 7.5 (ref 5.0–8.0)

## 2022-10-23 MED ORDER — AMOXICILLIN 250 MG PO CAPS: 250.0000 mg | ORAL_CAPSULE | Freq: Three times a day (TID) | ORAL | 0 refills | Status: DC

## 2022-10-24 MED ORDER — AMOXICILLIN 250 MG PO CAPS: 250.0000 mg | ORAL_CAPSULE | Freq: Three times a day (TID) | ORAL | 0 refills | Status: AC

## 2022-10-29 ENCOUNTER — Encounter: Payer: Self-pay | Admitting: Internal Medicine

## 2022-10-29 NOTE — Telephone Encounter (Signed)
Scheduled appointment

## 2022-10-30 ENCOUNTER — Encounter: Payer: Self-pay | Admitting: Internal Medicine

## 2022-10-30 ENCOUNTER — Ambulatory Visit (INDEPENDENT_AMBULATORY_CARE_PROVIDER_SITE_OTHER): Payer: Medicare Other | Admitting: Internal Medicine

## 2022-10-30 VITALS — BP 108/60 | HR 66 | Temp 98.4°F | Ht 65.5 in | Wt 183.0 lb

## 2022-10-30 DIAGNOSIS — H6692 Otitis media, unspecified, left ear: Secondary | ICD-10-CM

## 2022-10-30 DIAGNOSIS — H6690 Otitis media, unspecified, unspecified ear: Secondary | ICD-10-CM

## 2022-10-30 DIAGNOSIS — H6592 Unspecified nonsuppurative otitis media, left ear: Secondary | ICD-10-CM | POA: Diagnosis not present

## 2022-10-30 MED ORDER — LEVOFLOXACIN 500 MG PO TABS: 500.0000 mg | ORAL_TABLET | Freq: Every day | ORAL | 0 refills | Status: AC

## 2022-10-30 MED ORDER — METHYLPREDNISOLONE ACETATE 80 MG/ML IJ SUSP
80.0000 mg | Freq: Once | INTRAMUSCULAR | Status: AC
Start: 2022-10-30 — End: 2022-10-30
  Administered 2022-10-30: 80 mg via INTRAMUSCULAR

## 2022-10-30 NOTE — Progress Notes (Signed)
Patient Diana House, [name], Diana House as PCP - General (Internal Medicine)  Visit Date: 10/30/22  Subjective:    Patient ID: Diana House , Female   DOB: February 05, 1956, 67 y.o.    MRN: [id number]   67 y.o. Female presents today for persistent left ear pain.   She was treated for left otitis media here on 10/23/22 with amoxicillin.  She was here at that time for Medicare wellness visit and health maintenance exam.  Since then, has noted no improvement in his finish course of Amoxicillin.  No fever or shaking chills.  Ears feel stopped up.  Past Medical History:  Diagnosis Date   Depression    Fibrocystic breast disease    GERD (gastroesophageal reflux disease)    Hypertension    Thyroid disease    Vitamin D deficiency      Family History  Problem Relation Age of Onset   Diabetes Father    Heart disease Father    Hypertension Father    Heart disease Mother     Social history: Married: Retired Radio broadcast assistant     Review of Systems  Constitutional:  Negative for fever and malaise/fatigue.  HENT:  Positive for ear pain (Left ear). Negative for congestion.   Eyes:  Negative for blurred vision.  Respiratory:  Negative for cough and shortness of breath.   Cardiovascular:  Negative for chest pain, palpitations and leg swelling.  Gastrointestinal:  Negative for vomiting.  Musculoskeletal:  Negative for back pain.  Skin:  Negative for rash.  Neurological:  Negative for loss of consciousness and headaches.        Objective:   Vitals: BP 108/60   Pulse 66   Temp 98.4 F (36.9 C) (Tympanic)   Ht 5' 5.5" (1.664 m)   Wt 183 lb (83 kg)   SpO2 98%   BMI 29.99 kg/m   Pharynx is clear.  Both TMs are full with splayed light reflex, more prominent in left ear than the right ear.  TMs are red bilaterally.     Results:   Persistent bilateral otitis media that has not responded to course of Amoxicillin.         Component Value Date/Time   NA 138  10/19/2022 0918   K 4.2 10/19/2022 0918   CL 94 (L) 10/19/2022 0918   CO2 33 (H) 10/19/2022 0918   GLUCOSE 107 (H) 10/19/2022 0918   BUN 9 10/19/2022 0918   CREATININE 0.69 10/19/2022 0918   CALCIUM 9.9 10/19/2022 0918   PROT 7.3 10/19/2022 0918   ALBUMIN 3.9 05/24/2016 1051   AST 51 (H) 10/19/2022 0918   ALT 102 (H) 10/19/2022 0918   ALKPHOS 70 05/24/2016 1051   BILITOT 0.3 10/19/2022 0918   GFRNONAA 94 09/27/2020 1000   GFRAA 109 09/27/2020 1000     Lab Results  Component Value Date   WBC 10.8 10/19/2022   HGB 13.5 10/19/2022   HCT 39.8 10/19/2022   MCV 99.0 10/19/2022   PLT 449 (H) 10/19/2022    Lab Results  Component Value Date   CHOL 195 10/19/2022   HDL 37 (L) 10/19/2022   LDLCALC 125 (H) 10/19/2022   TRIG 212 (H) 10/19/2022   CHOLHDL 5.3 (H) 10/19/2022    No results found for: "HGBA1C"   Lab Results  Component Value Date   TSH 1.39 10/19/2022      Assessment & Plan:   Persistent bilateral otitis media: did not respond to amoxicillin. Prescribed  Levaquin 500 mg daily with a meal for 7 days. Administered Depomedrol 80 mg IM here in the office to help with ear congestion.  Call if not better in 7 days or sooner if worse.  I,Alexander Ruley,acting as a Neurosurgeon for Diana Mackintosh, Diana House.,have documented all relevant documentation on the behalf of Diana Mackintosh, Diana House,as directed by  Diana Mackintosh, Diana House while in the presence of Diana Mackintosh, Diana House.   I, Diana Mackintosh, Diana House, have reviewed all documentation for this visit. The documentation on 10/30/22 for the exam, diagnosis, procedures, and orders are all accurate and complete.

## 2022-10-30 NOTE — Patient Instructions (Addendum)
Levaquin 500 mg daily x 7 days. Depomedrol 80 mg IM x 1 given in office.  Call if not better in 7 to 10 days or sooner if worse.

## 2022-11-05 ENCOUNTER — Encounter: Payer: Self-pay | Admitting: Internal Medicine

## 2022-11-06 ENCOUNTER — Encounter: Payer: Self-pay | Admitting: Internal Medicine

## 2022-11-06 ENCOUNTER — Ambulatory Visit (INDEPENDENT_AMBULATORY_CARE_PROVIDER_SITE_OTHER): Payer: Medicare Other | Admitting: Internal Medicine

## 2022-11-06 VITALS — BP 118/66 | HR 62 | Temp 98.3°F | Ht 65.5 in | Wt 183.0 lb

## 2022-11-06 DIAGNOSIS — H6592 Unspecified nonsuppurative otitis media, left ear: Secondary | ICD-10-CM | POA: Diagnosis not present

## 2022-11-06 MED ORDER — FLUCONAZOLE 150 MG PO TABS: 150.0000 mg | ORAL_TABLET | Freq: Once | ORAL | 0 refills | Status: AC

## 2022-11-06 MED ORDER — METHYLPREDNISOLONE 4 MG PO TABS: ORAL_TABLET | ORAL | 0 refills | Status: DC

## 2022-11-06 MED ORDER — DOXYCYCLINE HYCLATE 100 MG PO TABS: 100.0000 mg | ORAL_TABLET | Freq: Two times a day (BID) | ORAL | 0 refills | Status: DC

## 2022-11-06 NOTE — Progress Notes (Signed)
Patient Care Team: Margaree Mackintosh, MD as PCP - General (Internal Medicine)  Visit Date: 11/06/22  Subjective:    Patient ID: Diana House , Female   DOB: [birth year], 67 y.o.    MRN: [id number]   67 y.o. Female presents today for persistent left ear pain. Previously treated with amoxicillin, levofloxacin, methylprednisolone. She spends a good amount of time around her grandson, who is in daycare.    Past Medical History:  Diagnosis Date   Depression    Fibrocystic breast disease    GERD (gastroesophageal reflux disease)    Hypertension    Thyroid disease    Vitamin D deficiency      Family History  Problem Relation Age of Onset   Diabetes Father    Heart disease Father    Hypertension Father    Heart disease Mother     Social History   Social History Narrative   Not on file      Review of Systems  Constitutional:  Negative for fever and malaise/fatigue.  HENT:  Positive for ear pain (Left). Negative for congestion.   Eyes:  Negative for blurred vision.  Respiratory:  Negative for cough and shortness of breath.   Cardiovascular:  Negative for chest pain, palpitations and leg swelling.  Gastrointestinal:  Negative for vomiting.  Musculoskeletal:  Negative for back pain.  Skin:  Negative for rash.  Neurological:  Negative for loss of consciousness and headaches.        Objective:   Vitals: BP 118/66   Pulse 62   Temp 98.3 F (36.8 C) (Tympanic)   Ht 5' 5.5" (1.664 m)   Wt 183 lb (83 kg)   SpO2 99%   BMI 29.99 kg/m    Physical Exam Vitals and nursing note reviewed.  Constitutional:      General: She is not in acute distress.    Appearance: Normal appearance. She is not toxic-appearing.  HENT:     Head: Normocephalic and atraumatic.     Right Ear: Hearing, tympanic membrane, ear canal and external ear normal.     Left Ear: Hearing, ear canal and external ear normal.     Ears:     Comments: Right TM clear, normal light reflex. Left TM retracted  near 11 o'clock, splayed light reflex. Pulmonary:     Effort: Pulmonary effort is normal.  Skin:    General: Skin is warm and dry.  Neurological:     Mental Status: She is alert and oriented to person, place, and time. Mental status is at baseline.  Psychiatric:        Mood and Affect: Mood normal.        Behavior: Behavior normal.        Thought Content: Thought content normal.        Judgment: Judgment normal.       Results:   Studies obtained and personally reviewed by me:    Labs:       Component Value Date/Time   NA 138 10/19/2022 0918   K 4.2 10/19/2022 0918   CL 94 (L) 10/19/2022 0918   CO2 33 (H) 10/19/2022 0918   GLUCOSE 107 (H) 10/19/2022 0918   BUN 9 10/19/2022 0918   CREATININE 0.69 10/19/2022 0918   CALCIUM 9.9 10/19/2022 0918   PROT 7.3 10/19/2022 0918   ALBUMIN 3.9 05/24/2016 1051   AST 51 (H) 10/19/2022 0918   ALT 102 (H) 10/19/2022 0918   ALKPHOS 70 05/24/2016 1051  BILITOT 0.3 10/19/2022 0918   GFRNONAA 94 09/27/2020 1000   GFRAA 109 09/27/2020 1000     Lab Results  Component Value Date   WBC 10.8 10/19/2022   HGB 13.5 10/19/2022   HCT 39.8 10/19/2022   MCV 99.0 10/19/2022   PLT 449 (H) 10/19/2022    Lab Results  Component Value Date   CHOL 195 10/19/2022   HDL 37 (L) 10/19/2022   LDLCALC 125 (H) 10/19/2022   TRIG 212 (H) 10/19/2022   CHOLHDL 5.3 (H) 10/19/2022    No results found for: "HGBA1C"   Lab Results  Component Value Date   TSH 1.39 10/19/2022      Assessment & Plan:   Persistent left otitis media: prescribed doxycycline 100 mg twice daily for 10 days, medrol dosepak tapering course take as directed, Diflucan 150 mg once if needed for Candida vaginitis. Contact me if symptoms do not improve.    I,Alexander Ruley,acting as a Neurosurgeon for Margaree Mackintosh, MD.,have documented all relevant documentation on the behalf of Margaree Mackintosh, MD,as directed by  Margaree Mackintosh, MD while in the presence of Margaree Mackintosh, MD.   I,  Margaree Mackintosh, MD, have reviewed all documentation for this visit. The documentation on 11/13/22 for the exam, diagnosis, procedures, and orders are all accurate and complete.

## 2022-11-13 ENCOUNTER — Other Ambulatory Visit: Payer: Medicare Other

## 2022-11-13 DIAGNOSIS — I10 Essential (primary) hypertension: Secondary | ICD-10-CM

## 2022-11-13 DIAGNOSIS — E039 Hypothyroidism, unspecified: Secondary | ICD-10-CM

## 2022-11-13 NOTE — Patient Instructions (Addendum)
Take Doxycycline 100 mg twice daily for 10 days.  A Medrol 4 mg Dosepak is been prescribed to take as directed.  Diflucan if needed for Candida vaginitis.  Contact me if symptoms not improving within a few days.

## 2022-11-14 LAB — HEPATIC FUNCTION PANEL
AG Ratio: 1.4 (calc) (ref 1.0–2.5)
ALT: 44 U/L — ABNORMAL HIGH (ref 6–29)
AST: 33 U/L (ref 10–35)
Albumin: 4 g/dL (ref 3.6–5.1)
Alkaline phosphatase (APISO): 92 U/L (ref 37–153)
Bilirubin, Direct: 0.1 mg/dL (ref 0.0–0.2)
Globulin: 2.8 g/dL (calc) (ref 1.9–3.7)
Indirect Bilirubin: 0.2 mg/dL (calc) (ref 0.2–1.2)
Total Bilirubin: 0.3 mg/dL (ref 0.2–1.2)
Total Protein: 6.8 g/dL (ref 6.1–8.1)

## 2022-11-14 LAB — LIPID PANEL
Cholesterol: 169 mg/dL (ref <200)
HDL: 60 mg/dL (ref >=50)
LDL Cholesterol (Calc): 85 mg/dL (calc)
Non-HDL Cholesterol (Calc): 109 mg/dL (calc) (ref <130)
Total CHOL/HDL Ratio: 2.8 (calc) (ref <5.0)
Triglycerides: 139 mg/dL (ref <150)

## 2022-11-14 LAB — TSH: TSH: 1.54 mIU/L (ref 0.40–4.50)

## 2022-11-22 ENCOUNTER — Telehealth: Payer: Self-pay | Admitting: Internal Medicine

## 2022-11-22 NOTE — Telephone Encounter (Signed)
Tyson Dense [phone]  Sheppard Penton called to say she is continuing to have trouble with her ear. She thinks she may need referral to ENT. Do you want me to go ahead a place referral to [facility]?

## 2022-11-23 ENCOUNTER — Other Ambulatory Visit: Payer: Self-pay | Admitting: Internal Medicine

## 2022-11-23 ENCOUNTER — Encounter: Payer: Self-pay | Admitting: Internal Medicine

## 2022-11-23 NOTE — Telephone Encounter (Signed)
Referral placed. Patient has appointment 12/31/2022

## 2023-01-14 ENCOUNTER — Ambulatory Visit (AMBULATORY_SURGERY_CENTER): Payer: Medicare Other

## 2023-01-14 ENCOUNTER — Encounter: Payer: Self-pay | Admitting: Gastroenterology

## 2023-01-14 VITALS — Ht 65.5 in | Wt 182.0 lb

## 2023-01-14 DIAGNOSIS — Z1211 Encounter for screening for malignant neoplasm of colon: Secondary | ICD-10-CM

## 2023-01-14 MED ORDER — NA SULFATE-K SULFATE-MG SULF 17.5-3.13-1.6 GM/177ML PO SOLN: 1.0000 | Freq: Once | ORAL | 0 refills | Status: AC

## 2023-01-14 NOTE — Progress Notes (Signed)
Pre visit completed via phone call; Patient verified name, DOB, and address;  No egg or soy allergy known to patient;  No issues known to pt with past sedation with any surgeries or procedures; Patient denies ever being told they had issues or difficulty with intubation;  No FH of Malignant Hyperthermia; Pt is not on diet pills; Pt is not on home 02;  Pt is not on blood thinners;  Pt denies issues with constipation;  No A fib or A flutter; Have any cardiac testing pending--NO Pt instructed to use [email or web address] or GoodRx for a price reduction on prep   Insurance verified during PV appt=[employer] Medicare  Patient's chart reviewed by Cathlyn Parsons CNRA prior to previsit and patient appropriate for the [facility].  Previsit completed and red dot placed by patient's name on their procedure day (on provider's schedule).    Instructions sent to patient via MyChart per her request;

## 2023-02-12 ENCOUNTER — Ambulatory Visit (AMBULATORY_SURGERY_CENTER): Payer: Medicare Other | Admitting: Gastroenterology

## 2023-02-12 ENCOUNTER — Encounter: Payer: Self-pay | Admitting: Gastroenterology

## 2023-02-12 VITALS — BP 117/58 | HR 65 | Temp 96.2°F | Resp 14 | Ht 65.5 in | Wt 182.0 lb

## 2023-02-12 DIAGNOSIS — Z1211 Encounter for screening for malignant neoplasm of colon: Secondary | ICD-10-CM | POA: Diagnosis not present

## 2023-02-12 DIAGNOSIS — K635 Polyp of colon: Secondary | ICD-10-CM

## 2023-02-12 DIAGNOSIS — D123 Benign neoplasm of transverse colon: Secondary | ICD-10-CM

## 2023-02-12 DIAGNOSIS — D122 Benign neoplasm of ascending colon: Secondary | ICD-10-CM

## 2023-02-12 DIAGNOSIS — K621 Rectal polyp: Secondary | ICD-10-CM

## 2023-02-12 DIAGNOSIS — D128 Benign neoplasm of rectum: Secondary | ICD-10-CM

## 2023-02-12 DIAGNOSIS — D120 Benign neoplasm of cecum: Secondary | ICD-10-CM

## 2023-02-12 MED ORDER — SODIUM CHLORIDE 0.9 % IV SOLN: 500.0000 mL | Freq: Once | INTRAVENOUS | Status: DC

## 2023-02-12 NOTE — Progress Notes (Signed)
[facility] History and Physical   Primary Care Physician:  Margaree Mackintosh, MD   Reason for Procedure:  Colorectal cancer screening  Plan:    Screening colonoscopy with possible interventions as needed     HPI: Diana House is a very pleasant 67 y.o. female here for screening colonoscopy. Denies any nausea, vomiting, abdominal pain, melena or bright red blood per rectum  The risks and benefits as well as alternatives of endoscopic procedure(s) have been discussed and reviewed. All questions answered. The patient agrees to proceed.    Past Medical History:  Diagnosis Date   Anxiety    on meds   Depression    on meds   Fibrocystic breast disease    GERD (gastroesophageal reflux disease)    hx of   Hypertension    on meds   Seasonal allergies    Thyroid disease    on meds   Vitamin D deficiency     Past Surgical History:  Procedure Laterality Date   CERVICAL DISCECTOMY  02/14/1999   c5-6; c6-7   ENDOMETRIAL ABLATION  07/17/2003   LUMBAR SPINE SURGERY  08/2016   SPINE SURGERY  2000   cervical x 2 (2022)   STRABISMUS SURGERY     age 79, age 89   TONSILLECTOMY      Prior to Admission medications   Medication Sig Start Date End Date Taking? Authorizing Provider  ALPRAZolam (XANAX) 0.25 MG tablet TAKE 1 TABLET BY MOUTH 3 TIMES  DAILY AS NEEDED FOR ANXIETY Patient taking differently: Take 0.25 mg by mouth as needed for anxiety. 05/23/22  Yes Margaree Mackintosh, MD  buPROPion (WELLBUTRIN XL) 300 MG 24 hr tablet TAKE 1 TABLET BY MOUTH DAILY 07/09/22  Yes Baxley, Luanna Cole, MD  Cholecalciferol (VITAMIN D) 2000 UNITS CAPS Take 1 capsule by mouth in the morning and at bedtime.  1 capsule in AM, and 2 capsules in PM;   Yes [provider]  escitalopram (LEXAPRO) 10 MG tablet TAKE 1 TABLET BY MOUTH DAILY 06/26/22  Yes Baxley, Luanna Cole, MD  estradiol (VIVELLE-DOT) 0.1 MG/24HR Place 1 patch onto the skin 2 (two) times a week.   Yes [provider]   hydrochlorothiazide (HYDRODIURIL) 25 MG tablet TAKE 1 TABLET BY MOUTH DAILY 08/15/22  Yes Baxley, Luanna Cole, MD  HYDROcodone-acetaminophen (NORCO) 10-325 MG tablet Take 1 tablet by mouth every 6 (six) hours as needed for moderate pain or severe pain. 09/14/21  Yes [provider]  levothyroxine (SYNTHROID) 137 MCG tablet TAKE 1 TABLET BY MOUTH DAILY  BEFORE BREAKFAST 08/06/22  Yes Baxley, Luanna Cole, MD  metoprolol succinate (TOPROL-XL) 25 MG 24 hr tablet TAKE 1 TABLET BY MOUTH ONCE  DAILY 11/23/22  Yes Baxley, Luanna Cole, MD  zolpidem (AMBIEN) 10 MG tablet Take 10 mg by mouth at bedtime as needed. 01/06/20  Yes [provider]  gabapentin (NEURONTIN) 100 MG capsule Take 100 mg by mouth 3 (three) times daily. Patient not taking: Reported on 02/12/2023 02/07/23   [provider]  hydrocortisone-pramoxine [facility]) 2.5-1 % rectal cream Place 1 Application rectally as needed for hemorrhoids or anal itching. 09/07/22   [provider]  ondansetron (ZOFRAN) 4 MG tablet TAKE 1 TABLET BY MOUTH EVERY 8 HOURS AS NEEDED FOR NAUSEA AND VOMITING 06/25/22   Margaree Mackintosh, MD  progesterone (PROMETRIUM) 200 MG capsule Take 200 mg by mouth.  Every 3 months    [provider]  promethazine (PHENERGAN) 25 MG tablet Take  1 tablet (25 mg total) by mouth every 8 (eight) hours as needed for nausea or vomiting. 11/21/21   Margaree Mackintosh, MD  tiZANidine (ZANAFLEX) 2 MG tablet Take 2 mg by mouth every 8 (eight) hours as needed for muscle spasms. 09/22/20   [provider]  triamcinolone cream (KENALOG) 0.1 % Apply 1 Application topically as needed. 02/23/14   [provider]    Current Outpatient Medications  Medication Sig Dispense Refill   ALPRAZolam (XANAX) 0.25 MG tablet TAKE 1 TABLET BY MOUTH 3 TIMES  DAILY AS NEEDED FOR ANXIETY (Patient taking differently: Take 0.25 mg by mouth as needed for anxiety.) 90 tablet 5   buPROPion (WELLBUTRIN XL) 300 MG 24 hr tablet TAKE 1  TABLET BY MOUTH DAILY 90 tablet 3   Cholecalciferol (VITAMIN D) 2000 UNITS CAPS Take 1 capsule by mouth in the morning and at bedtime.  1 capsule in AM, and 2 capsules in PM;     escitalopram (LEXAPRO) 10 MG tablet TAKE 1 TABLET BY MOUTH DAILY 90 tablet 3   estradiol (VIVELLE-DOT) 0.1 MG/24HR Place 1 patch onto the skin 2 (two) times a week.     hydrochlorothiazide (HYDRODIURIL) 25 MG tablet TAKE 1 TABLET BY MOUTH DAILY 90 tablet 3   HYDROcodone-acetaminophen (NORCO) 10-325 MG tablet Take 1 tablet by mouth every 6 (six) hours as needed for moderate pain or severe pain.     levothyroxine (SYNTHROID) 137 MCG tablet TAKE 1 TABLET BY MOUTH DAILY  BEFORE BREAKFAST 90 tablet 3   metoprolol succinate (TOPROL-XL) 25 MG 24 hr tablet TAKE 1 TABLET BY MOUTH ONCE  DAILY 90 tablet 3   zolpidem (AMBIEN) 10 MG tablet Take 10 mg by mouth at bedtime as needed.     gabapentin (NEURONTIN) 100 MG capsule Take 100 mg by mouth 3 (three) times daily. (Patient not taking: Reported on 02/12/2023)     hydrocortisone-pramoxine [facility]) 2.5-1 % rectal cream Place 1 Application rectally as needed for hemorrhoids or anal itching.     ondansetron (ZOFRAN) 4 MG tablet TAKE 1 TABLET BY MOUTH EVERY 8 HOURS AS NEEDED FOR NAUSEA AND VOMITING 20 tablet 2   progesterone (PROMETRIUM) 200 MG capsule Take 200 mg by mouth.  Every 3 months     promethazine (PHENERGAN) 25 MG tablet Take 1 tablet (25 mg total) by mouth every 8 (eight) hours as needed for nausea or vomiting. 30 tablet 1   tiZANidine (ZANAFLEX) 2 MG tablet Take 2 mg by mouth every 8 (eight) hours as needed for muscle spasms.     triamcinolone cream (KENALOG) 0.1 % Apply 1 Application topically as needed.     Current Facility-Administered Medications  Medication Dose Route Frequency Provider Last Rate Last Admin   0.9 %  sodium chloride infusion  500 mL Intravenous Once Napoleon Form, MD        Allergies as of 02/12/2023 - Review Complete 02/12/2023  Allergen  Reaction Noted   Clarithromycin Rash 11/27/1996    Family History  Problem Relation Age of Onset   Heart disease Mother    Diabetes Father    Heart disease Father    Hypertension Father    Colon polyps Father 54    Social History   Socioeconomic History   Marital status: Married    Spouse name: Not on file   Number of children: Not on file   Years of education: Not on file   Highest education level: Not on file  Occupational History  Not on file  Tobacco Use   Smoking status: Former    Current packs/day: 0.00    Types: Cigarettes    Quit date: 07/17/1987    Years since quitting: 35.6   Smokeless tobacco: Never  Vaping Use   Vaping status: Never Used  Substance and Sexual Activity   Alcohol use: Not Currently    Alcohol/week: 7.0 standard drinks of alcohol    Types: 7 Glasses of wine per week   Drug use: Never   Sexual activity: Not on file  Other Topics Concern   Not on file  Social History Narrative   Not on file   Social Determinants of Health   Financial Resource Strain: Not on file  Food Insecurity: Not on file  Transportation Needs: Not on file  Physical Activity: Not on file  Stress: Not on file  Social Connections: Not on file  Intimate Partner Violence: Not on file    Review of Systems:  All other review of systems negative except as mentioned in the HPI.  Physical Exam: Vital signs in last 24 hours: BP 121/70   Pulse (!) 58   Temp (!) 96.2 F (35.7 C)   Ht 5' 5.5" (1.664 m)   Wt 182 lb (82.6 kg)   SpO2 95%   BMI 29.83 kg/m  General:   Alert, NAD Lungs:  Clear .   Heart:  Regular rate and rhythm Abdomen:  Soft, nontender and nondistended. Neuro/Psych:  Alert and cooperative. Normal mood and affect. A and O x 3  Reviewed labs, radiology imaging, old records and pertinent past GI work up  Patient is appropriate for planned procedure(s) and anesthesia in an ambulatory setting   K. Scherry Ran , MD [phone]

## 2023-02-12 NOTE — Op Note (Signed)
[facility] Patient Name: Diana House Procedure Date: 02/12/2023 9:19 AM MRN: [id number] Endoscopist: Napoleon Form , MD, [id number] Age: 67 Referring MD:  Date of Birth: [birth year] Gender: Female Account #: [id number] Procedure:                Colonoscopy Indications:              Screening for colorectal malignant neoplasm Medicines:                Monitored Anesthesia Care Procedure:                Pre-Anesthesia Assessment:                           - Prior to the procedure, a History and Physical                            was performed, and patient medications and                            allergies were reviewed. The patient's tolerance of                            previous anesthesia was also reviewed. The risks                            and benefits of the procedure and the sedation                            options and risks were discussed with the patient.                            All questions were answered, and informed consent                            was obtained. Prior Anticoagulants: The patient has                            taken no anticoagulant or antiplatelet agents. ASA                            Grade Assessment: II - A patient with mild systemic                            disease. After reviewing the risks and benefits,                            the patient was deemed in satisfactory condition to                            undergo the procedure.                           After obtaining informed consent, the colonoscope  was passed under direct vision. Throughout the                            procedure, the patient's blood pressure, pulse, and                            oxygen saturations were monitored continuously. The                            [id number]-HQ190L Colonoscope [id number] was introduced                            through the anus and advanced to the the cecum,                            identified by  appendiceal orifice and ileocecal                            valve. The colonoscopy was performed without                            difficulty. The patient tolerated the procedure                            well. The quality of the bowel preparation was                            good. The ileocecal valve, appendiceal orifice, and                            rectum were photographed. Scope In: 9:25:38 AM Scope Out: 9:50:44 AM Scope Withdrawal Time: 0 hours 11 minutes 57 seconds  Total Procedure Duration: 0 hours 25 minutes 6 seconds  Findings:                 The perianal and digital rectal examinations were                            normal.                           A 14 mm polyp was found in the cecum. The polyp was                            sessile. The polyp was removed with a piecemeal                            technique using a cold snare. Resection and                            retrieval were complete.                           Six sessile polyps were found in the rectum,  transverse colon, ascending colon and cecum. The                            polyps were 4 to 6 mm in size. These polyps were                            removed with a cold snare. Resection and retrieval                            were complete.                           Scattered small-mouthed diverticula were found in                            the sigmoid colon.                           Non-bleeding external and internal hemorrhoids were                            found during retroflexion. The hemorrhoids were                            medium-sized. Complications:            No immediate complications. Estimated Blood Loss:     Estimated blood loss was minimal. Impression:               - One 14 mm polyp in the cecum, removed piecemeal                            using a cold snare. Resected and retrieved.                           - Six 4 to 6 mm polyps in the rectum, in the                             transverse colon, in the ascending colon and in the                            cecum, removed with a cold snare. Resected and                            retrieved.                           - Diverticulosis in the sigmoid colon.                           - Non-bleeding external and internal hemorrhoids. Recommendation:           - Resume previous diet.                           - Continue present medications.                           -  Await pathology results.                           - Use Benefiber one teaspoon PO TID.                           - Use Analpram HC Cream 2.5%: Apply externally as                            necessary for 7 days. Napoleon Form, MD 02/12/2023 10:01:31 AM This report has been signed electronically.

## 2023-02-12 NOTE — Progress Notes (Signed)
Called to room to assist during endoscopic procedure.  Patient ID and intended procedure confirmed with present staff. Received instructions for my participation in the procedure from the performing physician.  

## 2023-02-12 NOTE — Patient Instructions (Addendum)
Handout on polyps and diverticulosis given.    - Resume previous diet.                           - Continue present medications.                           - Await pathology results.                           - Use Benefiber one teaspoon PO TID.                           - Use Analpram HC Cream 2.5%: Apply externally as                            necessary for 7 days.    YOU HAD AN ENDOSCOPIC PROCEDURE TODAY AT THE [facility]:   Refer to the procedure report that was given to you for any specific questions about what was found during the examination.  If the procedure report does not answer your questions, please call your gastroenterologist to clarify.  If you requested that your care partner not be given the details of your procedure findings, then the procedure report has been included in a sealed envelope for you to review at your convenience later.  YOU SHOULD EXPECT: Some feelings of bloating in the abdomen. Passage of more gas than usual.  Walking can help get rid of the air that was put into your GI tract during the procedure and reduce the bloating. If you had a lower endoscopy (such as a colonoscopy or flexible sigmoidoscopy) you may notice spotting of blood in your stool or on the toilet paper. If you underwent a bowel prep for your procedure, you may not have a normal bowel movement for a few days.  Please Note:  You might notice some irritation and congestion in your nose or some drainage.  This is from the oxygen used during your procedure.  There is no need for concern and it should clear up in a day or so.  SYMPTOMS TO REPORT IMMEDIATELY:  Following lower endoscopy (colonoscopy or flexible sigmoidoscopy):  Excessive amounts of blood in the stool  Significant tenderness or worsening of abdominal pains  Swelling of the abdomen that is new, acute  Fever of 100F or higher   For urgent or emergent issues, a gastroenterologist can be reached at any hour by calling  [phone]. Do not use MyChart messaging for urgent concerns.    DIET:  We do recommend a small meal at first, but then you may proceed to your regular diet.  Drink plenty of fluids but you should avoid alcoholic beverages for 24 hours.  ACTIVITY:  You should plan to take it easy for the rest of today and you should NOT DRIVE or use heavy machinery until tomorrow (because of the sedation medicines used during the test).    FOLLOW UP: Our staff will call the number listed on your records the next business day following your procedure.  We will call around 7:15- 8:00 am to check on you and address any questions or concerns that you may have regarding the information given to you following your procedure. If we do not reach you, we will leave a message.  If any biopsies were taken you will be contacted by phone or by letter within the next 1-3 weeks.  Please call us at [phone] if you have not heard about the biopsies in 3 weeks.    SIGNATURES/CONFIDENTIALITY: You and/or your care partner have signed paperwork which will be entered into your electronic medical record.  These signatures attest to the fact that that the information above on your After Visit Summary has been reviewed and is understood.  Full responsibility of the confidentiality of this discharge information lies with you and/or your care-partner.

## 2023-02-12 NOTE — Progress Notes (Signed)
VS by SM  Pt's states no medical or surgical changes since previsit or office visit.  

## 2023-02-12 NOTE — Progress Notes (Signed)
Uneventful anesthetic. Report to pacu rn. Vss. Care resumed by rn. 

## 2023-02-13 ENCOUNTER — Telehealth: Payer: Self-pay

## 2023-02-13 NOTE — Telephone Encounter (Signed)
  Follow up Call-     02/12/2023    8:29 AM  Call back number  Post procedure Call Back phone  # [phone]  Permission to leave phone message Yes     Patient questions:  Do you have a fever, pain , or abdominal swelling? No. Pain Score  0 *  Have you tolerated food without any problems? Yes.    Have you been able to return to your normal activities? Yes.    Do you have any questions about your discharge instructions: Diet   No. Medications  No. Follow up visit  No.  Do you have questions or concerns about your Care? No.  Actions: * If pain score is 4 or above: No action needed, pain <4.

## 2023-02-15 ENCOUNTER — Encounter: Payer: Self-pay | Admitting: Gastroenterology

## 2023-04-08 ENCOUNTER — Other Ambulatory Visit: Payer: Self-pay

## 2023-04-08 MED ORDER — ALPRAZOLAM 0.25 MG PO TABS: 0.2500 mg | ORAL_TABLET | Freq: Three times a day (TID) | ORAL | 1 refills | Status: DC | PRN

## 2023-04-08 NOTE — Telephone Encounter (Addendum)
Patient is requesting a refill on ALPRAZOLAM 0.25, last refill 05/23/22 #90 +5

## 2023-04-17 NOTE — Progress Notes (Signed)
Patient Care Team: Margaree Mackintosh, MD as PCP - General (Internal Medicine)  Visit Date: 04/25/23  Subjective:    Patient ID: Diana House , Female   DOB: [birth year], 67 y.o.    MRN: [id number]   67 y.o. Female presents today for a 60-month follow-up. History of anxiety and depression, fibrocystic breast disease GERD, hypertension, seasonal allergies, Vitamin D deficiency, hypothyroidism.  History of anxiety and depression treated with alprazolam 0.25 mg three times daily as needed, escitalopram 10 mg daily, bupropion 300 mg daily.  She has been having lower back pain.  History of plantar fascitis. This has made her hesitant to walk frequently.  History of hypertriglyceridemia currently diet controlled. TRIG elevated at 179. ALT elevated at 35. She is eating healthy. Does not exercise.  History of insomnia treated with zolpidem 10 mg at bedtime as needed.  History of hypertension treated with hydrochlorothiazide 25 mg daily, metoprolol succinate 25 mg daily. Blood pressure normal in-office today at 120/70.  History of hypothyroidism treated with levothyroxine 137 mcg daily. TSH at 3.6 on 04/23/23, up from 1.54 on 11/13/22.  History of musculoskeletal pain treated with Norco 10-325 mg every 6 hours as needed.   Pap smear last completed 09/07/2022.   Mammogram last completed 04/17/2022. Seen by Dr. Eustaquio Boyden.  Negative Cologuard 09/10/2021.  Past Medical History:  Diagnosis Date   Anxiety    on meds   Depression    on meds   Fibrocystic breast disease    GERD (gastroesophageal reflux disease)    hx of   Hypertension    on meds   Seasonal allergies    Thyroid disease    on meds   Vitamin D deficiency      Family History  Problem Relation Age of Onset   Heart disease Mother    Diabetes Father    Heart disease Father    Hypertension Father    Colon polyps Father 46    Social Hx: retired ED Engineer, civil (consulting). Husband is a retired Radiation protection practitioner. Has adult children. Nonsmoker. No  alcohol consumption.     Review of Systems  Constitutional:  Negative for fever and malaise/fatigue.  HENT:  Negative for congestion.   Eyes:  Negative for blurred vision.  Respiratory:  Negative for cough and shortness of breath.   Cardiovascular:  Negative for chest pain, palpitations and leg swelling.  Gastrointestinal:  Negative for vomiting.  Musculoskeletal:  Positive for back pain (Lower).  Skin:  Negative for rash.  Neurological:  Negative for loss of consciousness and headaches.        Objective:   Vitals: BP 120/70   Pulse 66   Ht 5' 5.5" (1.664 m)   Wt 189 lb (85.7 kg)   SpO2 98%   BMI 30.97 kg/m    Physical Exam Vitals and nursing note reviewed.  Constitutional:      General: She is not in acute distress.    Appearance: Normal appearance. She is not toxic-appearing.  HENT:     Head: Normocephalic and atraumatic.  Cardiovascular:     Rate and Rhythm: Normal rate and regular rhythm. No extrasystoles are present.    Pulses: Normal pulses.     Heart sounds: Normal heart sounds. No murmur heard.    No friction rub. No gallop.  Pulmonary:     Effort: Pulmonary effort is normal. No respiratory distress.     Breath sounds: Normal breath sounds. No wheezing or rales.  Skin:    General: Skin is  warm and dry.  Neurological:     Mental Status: She is alert and oriented to person, place, and time. Mental status is at baseline.  Psychiatric:        Mood and Affect: Mood normal.        Behavior: Behavior normal.        Thought Content: Thought content normal.        Judgment: Judgment normal.       Results:   Studies obtained and personally reviewed by me:  Pap smear last completed 09/07/2022.   Mammogram last completed 04/17/2022. Seen by Dr. Eustaquio Boyden.  Negative Cologuard 09/10/2021.  Labs:       Component Value Date/Time   NA 138 10/19/2022 0918   K 4.2 10/19/2022 0918   CL 94 (L) 10/19/2022 0918   CO2 33 (H) 10/19/2022 0918   GLUCOSE 107 (H)  10/19/2022 0918   BUN 9 10/19/2022 0918   CREATININE 0.69 10/19/2022 0918   CALCIUM 9.9 10/19/2022 0918   PROT 6.7 04/23/2023 0945   ALBUMIN 3.9 05/24/2016 1051   AST 27 04/23/2023 0945   ALT 35 (H) 04/23/2023 0945   ALKPHOS 70 05/24/2016 1051   BILITOT 0.4 04/23/2023 0945   GFRNONAA 94 09/27/2020 1000   GFRAA 109 09/27/2020 1000     Lab Results  Component Value Date   WBC 10.8 10/19/2022   HGB 13.5 10/19/2022   HCT 39.8 10/19/2022   MCV 99.0 10/19/2022   PLT 449 (H) 10/19/2022    Lab Results  Component Value Date   CHOL 170 04/23/2023   HDL 56 04/23/2023   LDLCALC 86 04/23/2023   TRIG 179 (H) 04/23/2023   CHOLHDL 3.0 04/23/2023    No results found for: "HGBA1C"   Lab Results  Component Value Date   TSH 3.60 04/23/2023      Assessment & Plan:   Sciatica: she has seen a specialist and does not wish to pursue further intervention at this time. Continue to monitor.  Anxiety and depression: stable with alprazolam 0.25 mg three times daily as needed, escitalopram 10 mg daily, bupropion 300 mg daily.  Hypertriglyceridemia: currently diet controlled. TRIG elevated at 179. ALT elevated at 35. Suggested healthy weight clinic.   Elevated glucose: glucose elevated at 107 on 10/19/22. Ordered A1c to rule out impaired glucose tolerance.Addendum: Hgb AIC is 5.4%  Insomnia: stable with zolpidem 10 mg at bedtime as needed.  Hypertension: treated with hydrochlorothiazide 25 mg daily, metoprolol succinate 25 mg daily. Blood pressure normal in-office today at 120/70.  Hypothyroidism: increase levothyroxine to 150 mcg daily. TSH at 3.6 on 04/23/23, up from 1.54 on 11/13/22.Recheck TSH only in about 6 weeks. Next OV in 6 months. Aim for TSH of around 1.00  Musculoskeletal pain: stable with Norco 10-325 mg every 6 hours as needed.   Pap smear last completed 09/07/2022.   Mammogram last completed 04/17/2022. Seen by GYN, Dr. Eustaquio Boyden.  Negative Cologuard 09/10/2021.  Vaccine  counseling: reports she is UTD on flu, Covid-19 boosters. Received these in 9/24.    I,Alexander Ruley,acting as a Neurosurgeon for Margaree Mackintosh, MD.,have documented all relevant documentation on the behalf of Margaree Mackintosh, MD,as directed by  Margaree Mackintosh, MD while in the presence of Margaree Mackintosh, MD.   I, Margaree Mackintosh, MD, have reviewed all documentation for this visit. The documentation on 05/06/23 for the exam, diagnosis, procedures, and orders are all accurate and complete.

## 2023-04-23 ENCOUNTER — Other Ambulatory Visit: Payer: Medicare Other

## 2023-04-23 DIAGNOSIS — Z13220 Encounter for screening for lipoid disorders: Secondary | ICD-10-CM

## 2023-04-23 DIAGNOSIS — E039 Hypothyroidism, unspecified: Secondary | ICD-10-CM

## 2023-04-23 DIAGNOSIS — I10 Essential (primary) hypertension: Secondary | ICD-10-CM

## 2023-04-24 LAB — HEPATIC FUNCTION PANEL
AG Ratio: 1.6 (calc) (ref 1.0–2.5)
ALT: 35 U/L — ABNORMAL HIGH (ref 6–29)
AST: 27 U/L (ref 10–35)
Albumin: 4.1 g/dL (ref 3.6–5.1)
Alkaline phosphatase (APISO): 78 U/L (ref 37–153)
Bilirubin, Direct: 0.1 mg/dL (ref 0.0–0.2)
Globulin: 2.6 g/dL (ref 1.9–3.7)
Indirect Bilirubin: 0.3 mg/dL (ref 0.2–1.2)
Total Bilirubin: 0.4 mg/dL (ref 0.2–1.2)
Total Protein: 6.7 g/dL (ref 6.1–8.1)

## 2023-04-24 LAB — LIPID PANEL
Cholesterol: 170 mg/dL
HDL: 56 mg/dL
LDL Cholesterol (Calc): 86 mg/dL
Non-HDL Cholesterol (Calc): 114 mg/dL
Total CHOL/HDL Ratio: 3 (calc)
Triglycerides: 179 mg/dL — ABNORMAL HIGH

## 2023-04-24 LAB — TSH: TSH: 3.6 m[IU]/L (ref 0.40–4.50)

## 2023-04-25 ENCOUNTER — Ambulatory Visit: Payer: Medicare Other | Admitting: Internal Medicine

## 2023-04-25 ENCOUNTER — Encounter: Payer: Self-pay | Admitting: Internal Medicine

## 2023-04-25 VITALS — BP 120/70 | HR 66 | Ht 65.5 in | Wt 189.0 lb

## 2023-04-25 DIAGNOSIS — I10 Essential (primary) hypertension: Secondary | ICD-10-CM

## 2023-04-25 DIAGNOSIS — R7309 Other abnormal glucose: Secondary | ICD-10-CM | POA: Diagnosis not present

## 2023-04-25 DIAGNOSIS — E781 Pure hyperglyceridemia: Secondary | ICD-10-CM | POA: Diagnosis not present

## 2023-04-25 DIAGNOSIS — F32A Depression, unspecified: Secondary | ICD-10-CM

## 2023-04-25 DIAGNOSIS — E039 Hypothyroidism, unspecified: Secondary | ICD-10-CM | POA: Diagnosis not present

## 2023-04-25 DIAGNOSIS — F419 Anxiety disorder, unspecified: Secondary | ICD-10-CM

## 2023-04-25 DIAGNOSIS — Z87898 Personal history of other specified conditions: Secondary | ICD-10-CM

## 2023-04-25 MED ORDER — LEVOTHYROXINE SODIUM 150 MCG PO TABS: 150.0000 ug | ORAL_TABLET | Freq: Every day | ORAL | 0 refills | Status: DC

## 2023-04-25 NOTE — Patient Instructions (Addendum)
Hgb AIC drawn. Consider Metformin depending on results. Increase Levothyroxine to 150 mcg daily and recheck TSH with OV in 6 weeks

## 2023-04-26 LAB — HEMOGLOBIN A1C
Hgb A1c MFr Bld: 5.4 %{Hb} (ref <5.7)
Mean Plasma Glucose: 108 mg/dL
eAG (mmol/L): 6 mmol/L

## 2023-05-08 ENCOUNTER — Other Ambulatory Visit: Payer: Self-pay | Admitting: Internal Medicine

## 2023-05-14 ENCOUNTER — Other Ambulatory Visit: Payer: Self-pay | Admitting: Internal Medicine

## 2023-05-22 ENCOUNTER — Ambulatory Visit (INDEPENDENT_AMBULATORY_CARE_PROVIDER_SITE_OTHER): Payer: Medicare Other | Admitting: Adult Health

## 2023-05-22 ENCOUNTER — Encounter (INDEPENDENT_AMBULATORY_CARE_PROVIDER_SITE_OTHER): Payer: Self-pay | Admitting: Adult Health

## 2023-05-22 VITALS — BP 128/75 | HR 60 | Temp 98.4°F | Ht 65.5 in | Wt 185.0 lb

## 2023-05-22 DIAGNOSIS — Z6830 Body mass index (BMI) 30.0-30.9, adult: Secondary | ICD-10-CM | POA: Diagnosis not present

## 2023-05-22 DIAGNOSIS — E669 Obesity, unspecified: Secondary | ICD-10-CM | POA: Diagnosis not present

## 2023-05-22 DIAGNOSIS — E781 Pure hyperglyceridemia: Secondary | ICD-10-CM | POA: Diagnosis not present

## 2023-05-22 DIAGNOSIS — Z0289 Encounter for other administrative examinations: Secondary | ICD-10-CM

## 2023-05-22 DIAGNOSIS — R7309 Other abnormal glucose: Secondary | ICD-10-CM

## 2023-05-22 NOTE — Progress Notes (Signed)
Office: [phone]  /  Fax: [phone]   Initial Visit  Diana House was seen in clinic today to evaluate for obesity. She is interested in losing weight to improve overall health and reduce the risk of weight related complications. She presents today to review program treatment options, initial physical assessment, and evaluation.     She was referred by: PCP  When asked what else they would like to accomplish? She states: Improve energy levels and physical activity, Improve existing medical conditions, Reduce number of medications, Reduce risk for a surgery, Improve quality of life, Improve appearance, and Lose a target amount of weight : Current weight 185 lbs, goal weight 165 lbs  Weight history: Per pt- weight was a concern her entire life.    When asked how has your weight affected you? She states: Has affected self-esteem, Contributed to medical problems, Contributed to orthopedic problems or mobility issues, Having fatigue, Having poor endurance, and Has affected mood   Some associated conditions: Hypertension, Fatty liver disease, and Vitamin D Deficiency  Contributing factors: Family history, Medications, Stress, Reduced physical activity, and Hx of pregnancies  Weight promoting medications identified: Beta-blockers, Psychotropic medications, and Other: Narcotic pain medication  Current nutrition plan: None  Current level of physical activity: None  Current or previous pharmacotherapy: None  Response to medication: Never tried medications   Past medical history includes:   Past Medical History:  Diagnosis Date   Anxiety    on meds   Depression    on meds   Fibrocystic breast disease    GERD (gastroesophageal reflux disease)    hx of   Hypertension    on meds   Seasonal allergies    Thyroid disease    on meds   Vitamin D deficiency      Objective:   BP 128/75   Pulse 60   Temp 98.4 F (36.9 C)   Ht 5' 5.5" (1.664 m)   Wt 185 lb (83.9 kg)   SpO2  98%   BMI 30.32 kg/m  She was weighed on the bioimpedance scale: Body mass index is 30.32 kg/m.  Peak Weight:190 , Body Fat%:40.4, Visceral Fat Rating:11, Weight trend over the last 12 months: Increasing   General:  Alert, oriented and cooperative. Patient is in no acute distress.  Respiratory: Normal respiratory effort, no problems with respiration noted   Gait: able to ambulate independently  Mental Status: Normal mood and affect. Normal behavior. Normal judgment and thought content.   DIAGNOSTIC DATA REVIEWED:  BMET    Component Value Date/Time   NA 138 10/19/2022 0918   K 4.2 10/19/2022 0918   CL 94 (L) 10/19/2022 0918   CO2 33 (H) 10/19/2022 0918   GLUCOSE 107 (H) 10/19/2022 0918   BUN 9 10/19/2022 0918   CREATININE 0.69 10/19/2022 0918   CALCIUM 9.9 10/19/2022 0918   GFRNONAA 94 09/27/2020 1000   GFRAA 109 09/27/2020 1000   Lab Results  Component Value Date   HGBA1C 5.4 04/25/2023   No results found for: "INSULIN" CBC    Component Value Date/Time   WBC 10.8 10/19/2022 0918   RBC 4.02 10/19/2022 0918   HGB 13.5 10/19/2022 0918   HCT 39.8 10/19/2022 0918   PLT 449 (H) 10/19/2022 0918   MCV 99.0 10/19/2022 0918   MCH 33.6 (H) 10/19/2022 0918   MCHC 33.9 10/19/2022 0918   RDW 11.8 10/19/2022 0918   Iron/TIBC/Ferritin/ %Sat    Component Value Date/Time   IRON 129 10/23/2013 1550  TIBC 295 10/23/2013 1550   FERRITIN 437 (H) 10/23/2013 1550   IRONPCTSAT 44 10/23/2013 1550   Lipid Panel     Component Value Date/Time   CHOL 170 04/23/2023 0945   TRIG 179 (H) 04/23/2023 0945   HDL 56 04/23/2023 0945   CHOLHDL 3.0 04/23/2023 0945   VLDL 23 05/24/2016 1051   LDLCALC 86 04/23/2023 0945   Hepatic Function Panel     Component Value Date/Time   PROT 6.7 04/23/2023 0945   ALBUMIN 3.9 05/24/2016 1051   AST 27 04/23/2023 0945   ALT 35 (H) 04/23/2023 0945   ALKPHOS 70 05/24/2016 1051   BILITOT 0.4 04/23/2023 0945   BILIDIR 0.1 04/23/2023 0945   IBILI 0.3  04/23/2023 0945      Component Value Date/Time   TSH 3.60 04/23/2023 0945     Assessment and Plan:   Elevated glucose  Hypertriglyceridemia  Obesity (BMI 30-39.9), Starting BMI 30.4  ESTABLISH WITH HWW   Obesity Treatment / Action Plan:  Patient will work on garnering support from family and friends to begin weight loss journey. Will work on eliminating or reducing the presence of highly palatable, calorie House foods in the home. Will complete provided nutritional and psychosocial assessment questionnaire before the next appointment. Will be scheduled for indirect calorimetry to determine resting energy expenditure in a fasting state.  This will allow [place] to create a reduced calorie, high-protein meal plan to promote loss of fat mass while preserving muscle mass. Counseled on the health benefits of losing 5%-15% of total body weight. Was counseled on nutritional approaches to weight loss and benefits of reducing processed foods and consuming plant-based foods and high quality protein as part of nutritional weight management. Was counseled on pharmacotherapy and role as an adjunct in weight management.   Obesity Education Performed Today:  She was weighed on the bioimpedance scale and results were discussed and documented in the synopsis.  We discussed obesity as a disease and the importance of a more detailed evaluation of all the factors contributing to the disease.  We discussed the importance of long term lifestyle changes which include nutrition, exercise and behavioral modifications as well as the importance of customizing this to her specific health and social needs.  We discussed the benefits of reaching a healthier weight to alleviate the symptoms of existing conditions and reduce the risks of the biomechanical, metabolic and psychological effects of obesity.  Diana House appears to be in the action stage of change and states they are ready to start intensive lifestyle  modifications and behavioral modifications.  30 minutes was spent today on this visit including the above counseling, pre-visit chart review, and post-visit documentation.  Reviewed by clinician on day of visit: allergies, medications, problem list, medical history, surgical history, family history, social history, and previous encounter notes pertinent to obesity diagnosis.  Cuauhtemoc Huegel d. Swati Granberry, NP-C

## 2023-06-04 ENCOUNTER — Other Ambulatory Visit: Payer: Medicare Other

## 2023-06-04 DIAGNOSIS — E039 Hypothyroidism, unspecified: Secondary | ICD-10-CM

## 2023-06-05 LAB — TSH: TSH: 4.49 m[IU]/L (ref 0.40–4.50)

## 2023-06-11 ENCOUNTER — Other Ambulatory Visit: Payer: Self-pay | Admitting: Internal Medicine

## 2023-07-01 ENCOUNTER — Other Ambulatory Visit: Payer: Self-pay | Admitting: Internal Medicine

## 2023-07-02 ENCOUNTER — Ambulatory Visit (INDEPENDENT_AMBULATORY_CARE_PROVIDER_SITE_OTHER): Payer: Medicare Other | Admitting: Family Medicine

## 2023-07-02 ENCOUNTER — Encounter (INDEPENDENT_AMBULATORY_CARE_PROVIDER_SITE_OTHER): Payer: Self-pay | Admitting: Family Medicine

## 2023-07-02 VITALS — BP 124/76 | HR 62 | Temp 97.9°F | Ht 65.0 in | Wt 185.0 lb

## 2023-07-02 DIAGNOSIS — I10 Essential (primary) hypertension: Secondary | ICD-10-CM | POA: Diagnosis not present

## 2023-07-02 DIAGNOSIS — R0602 Shortness of breath: Secondary | ICD-10-CM | POA: Diagnosis not present

## 2023-07-02 DIAGNOSIS — E559 Vitamin D deficiency, unspecified: Secondary | ICD-10-CM | POA: Diagnosis not present

## 2023-07-02 DIAGNOSIS — R7309 Other abnormal glucose: Secondary | ICD-10-CM

## 2023-07-02 DIAGNOSIS — E039 Hypothyroidism, unspecified: Secondary | ICD-10-CM

## 2023-07-02 DIAGNOSIS — R5383 Other fatigue: Secondary | ICD-10-CM | POA: Diagnosis not present

## 2023-07-02 DIAGNOSIS — K760 Fatty (change of) liver, not elsewhere classified: Secondary | ICD-10-CM

## 2023-07-02 DIAGNOSIS — R739 Hyperglycemia, unspecified: Secondary | ICD-10-CM

## 2023-07-02 DIAGNOSIS — Z6830 Body mass index (BMI) 30.0-30.9, adult: Secondary | ICD-10-CM

## 2023-07-02 DIAGNOSIS — Z1331 Encounter for screening for depression: Secondary | ICD-10-CM

## 2023-07-02 DIAGNOSIS — E669 Obesity, unspecified: Secondary | ICD-10-CM

## 2023-07-02 DIAGNOSIS — E781 Pure hyperglyceridemia: Secondary | ICD-10-CM

## 2023-07-02 DIAGNOSIS — M5090 Cervical disc disorder, unspecified, unspecified cervical region: Secondary | ICD-10-CM

## 2023-07-02 NOTE — Assessment & Plan Note (Signed)
BP controlled today.  She is on hydrochlorothiazide and metoprolol daily.  She does have palpitations in addition to hypertension.  No chest pain, chest pressure, headache.  Previously wore a holter monitor that unfortunately did not catch anything in terms of arrhythmias.

## 2023-07-02 NOTE — Assessment & Plan Note (Addendum)
Sees pain management physician and is on tizanidine and hydrocodone.  She feels her chronic pain is relatively well managed unless something comes up.  Surgeon is at [facility].

## 2023-07-02 NOTE — Assessment & Plan Note (Signed)
Ultrasound done in 2014 showing likely fatty infiltration of liver.  No follow up imaging.  LFTS within normal limits recently; LFTs elevated 10 years ago.  CMP today.

## 2023-07-02 NOTE — Assessment & Plan Note (Signed)
Patient taking 1 capsule in the am and 2 in the pm.  Last level close to goal.  Will repeat Vit D level today.  No nausea, vomiting or muscle weakness.

## 2023-07-02 NOTE — Assessment & Plan Note (Signed)
Elevated blood sugars in the past- A1c WNL in October.  Will order fasting insulin level today.

## 2023-07-02 NOTE — Assessment & Plan Note (Signed)
Diagnosed 20 years ago and patient is on daily.  Last TSH within normal limits.  No cold intolerance, heat intolerance or palpitations.

## 2023-07-02 NOTE — Progress Notes (Signed)
Chief Complaint:  Obesity   Subjective:  Diana House (MR# [id number]) is a 67 y.o. female who presents for evaluation and treatment of obesity and related comorbidities.   Diana House is currently in the action stage of change and ready to dedicate time achieving and maintaining a healthier weight. Diana House is interested in becoming our patient and working on intensive lifestyle modifications including (but not limited to) diet and exercise for weight loss.  Diana House has been struggling with her weight. She has been unsuccessful in either losing weight, maintaining weight loss, or reaching her healthy weight goal.  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  She used to work at [employer].  Married, lives at home with husband Diana House.    Diana House habits were reviewed today and are as follows: Her family eats meals together, she thinks her family will eat healthier with her, her desired weight loss is 25 lbs, and she started gaining weight at age 66.  Attributes weight gain to depression meds- paxil then celexa.  She has previously tried low carb with minimal results.  Previously struggled with anorexia. Husband does most of the grocery shopping and cooking.  Tends to crave salads, lean meats and vegetables.  Food Recall: Coffee in am with creamer (1tsp) X 2.  Breakfast is Total cereal with 1% milk (1 cup cereal, 1/2 cup milk)- feels satisfied.  Hunger varies- sometimes she can get hungry around 11am- may eat apple, jalapeno cheese strips- feels satisfied.  Dinner vegetables and beef stir fry or vegetable chili. May do half a cup to a cup of vegetables.  No dessert but does drink diluted wine 2-3 glasses in the evening.    Indirect Calorimeter completed today shows a RMR: 1440. Her calculated basal metabolic rate is 4098 thus her basal metabolic rate is worse than expected.  Other Fatigue Gazelle reports daytime somnolence and  occasionally  waking up still tired. Patient has a history of symptoms of daytime fatigue. Diana House  generally gets 9 or 10 hours of sleep per night, and states that she has nightime awakenings. Snoring is not present. Apneic episodes are not present. Epworth Sleepiness Score is 7.   Shortness of Breath Diana House notes increasing shortness of breath with exercising and seems to be worsening over time with weight gain. She notes getting out of breath sooner with activity than she used to. This has gotten worse recently. Diana House denies shortness of breath at rest or orthopnea.  Depression Screen Diana House Food and Mood (modified PHQ-9) score was 9.     11/06/2022    3:03 PM  Depression screen PHQ 2/9  Decreased Interest 0  Down, Depressed, Hopeless 0  PHQ - 2 Score 0     Objective:  Vitals Temp: 97.9 F (36.6 C) BP: 124/76 Pulse Rate: 62 SpO2: 99 %   Anthropometric Measurements Height: 5\' 5"  (1.651 m) Weight: 185 lb (83.9 kg) BMI (Calculated): 30.79 Starting Weight: 185 lb Waist Measurement : 45 inches   Body Composition  Body Fat %: 42.2 % Fat Mass (lbs): 78.4 lbs Muscle Mass (lbs): 102 lbs Total Body Water (lbs): 71.4 lbs Visceral Fat Rating : 12   Other Clinical Data RMR: 1440 Fasting: yes Labs: yes Today's Visit #: 1 Starting Date: 07/02/23    EKG: Normal sinus rhythm, rate 66bpm (EKG done 10/23/22).  General: Cooperative, alert, well developed, in no acute distress. HEENT: Conjunctivae and lids unremarkable. Cardiovascular: Regular rhythm.  Lungs: Normal work of breathing. Neurologic: No focal deficits.  Lab Results  Component Value Date   CREATININE 0.69 10/19/2022   BUN 9 10/19/2022   NA 138 10/19/2022   K 4.2 10/19/2022   CL 94 (L) 10/19/2022   CO2 33 (H) 10/19/2022   Lab Results  Component Value Date   ALT 35 (H) 04/23/2023   AST 27 04/23/2023   ALKPHOS 70 05/24/2016   BILITOT 0.4 04/23/2023   Lab Results  Component Value Date   HGBA1C 5.4 04/25/2023   No results found for: "INSULIN" Lab Results  Component Value Date   TSH 4.49 06/04/2023    Lab Results  Component Value Date   CHOL 170 04/23/2023   HDL 56 04/23/2023   LDLCALC 86 04/23/2023   TRIG 179 (H) 04/23/2023   CHOLHDL 3.0 04/23/2023   Lab Results  Component Value Date   WBC 10.8 10/19/2022   HGB 13.5 10/19/2022   HCT 39.8 10/19/2022   MCV 99.0 10/19/2022   PLT 449 (H) 10/19/2022   Lab Results  Component Value Date   IRON 129 10/23/2013   TIBC 295 10/23/2013   FERRITIN 437 (H) 10/23/2013    Assessment and Plan:   Other Fatigue  Diana House does feel that her weight is causing her energy to be lower than it should be. Fatigue may be related to obesity, depression or many other causes. Labs will be ordered, and in the meanwhile, Diana House will focus on self care including making healthy food choices, increasing physical activity and focusing on stress reduction.  Shortness of Breath  Diana House does feel that she gets out of breath more easily that she used to when she exercises. 's shortness of breath appears to be obesity related and exercise induced. She has agreed to work on weight loss and gradually increase exercise to treat her exercise induced shortness of breath. Will continue to monitor closely.  Diana House had a positive depression screening. Depression is commonly associated with obesity and often results in emotional eating behaviors. Diana will monitor this closely and work on CBT to help improve the non-hunger eating patterns. Referral to Psychology may be required if no improvement is seen as she continues in our clinic.    Problem List Items Addressed This Visit       Cardiovascular and Mediastinum   Hypertension   BP controlled today.  She is on hydrochlorothiazide and metoprolol daily.  She does have palpitations in addition to hypertension.  No chest pain, chest pressure, headache.  Previously wore a holter monitor that unfortunately did not catch anything in terms of arrhythmias.         Digestive   Hepatic steatosis   Ultrasound done in 2014 showing likely  fatty infiltration of liver.  No follow up imaging.  LFTS within normal limits recently; LFTs elevated 10 years ago.  CMP today.      Relevant Orders   Comprehensive metabolic panel     Endocrine   Hypothyroidism   Diagnosed 20 years ago and patient is on daily.  Last TSH within normal limits.  No cold intolerance, heat intolerance or palpitations.      Relevant Orders   T3   T4, free     Musculoskeletal and Integument   Cervical disc disease   Sees pain management physician and is on tizanidine and hydrocodone.  She feels her chronic pain is relatively well managed unless something comes up.  Surgeon is at [facility].        Other   Vitamin D deficiency - Primary  Patient taking 1 capsule in the am and 2 in the pm.  Last level close to goal.  Will repeat Vit D level today.  No nausea, vomiting or muscle weakness.      Relevant Orders   VITAMIN D 25 Hydroxy (Vit-D Deficiency, Fractures)   Hyperglycemia   Elevated blood sugars in the past- A1c WNL in October.  Will order fasting insulin level today.      Relevant Orders   Insulin, random   Other Visit Diagnoses       Other fatigue         SOBOE (shortness of breath on exertion)       Relevant Orders   CBC with Differential/Platelet     Elevated glucose         Hypertriglyceridemia         Obesity (BMI 30-39.9), Starting BMI 30.4         BMI 30.0-30.9,adult           Diana House is currently in the action stage of change and her goal is to continue with weight loss efforts. I recommend Diana House begin the structured treatment plan as follows:  She has agreed to Category 2 Plan  Exercise goals: No exercise has been prescribed at this time.  Behavioral modification strategies:increasing lean protein intake, increasing vegetables, decrease ETOH, no skipping meals, holiday eating strategies, and planning for success  She was informed of the importance of frequent follow-up visits to maximize her success with  intensive lifestyle modifications for her multiple health conditions. She was informed Diana would discuss her lab results at her next visit unless there is a critical issue that needs to be addressed sooner. Diana House agreed to keep her next visit at the agreed upon time to discuss these results.   Attestation Statements:  Reviewed by clinician on day of visit: allergies, medications, problem list, medical history, surgical history, family history, social history, and previous encounter notes.   Reuben Likes, MD

## 2023-07-04 LAB — CBC WITH DIFFERENTIAL/PLATELET
Basophils Absolute: 0.1 10*3/uL (ref 0.0–0.2)
Basos: 1 %
EOS (ABSOLUTE): 0.4 10*3/uL (ref 0.0–0.4)
Eos: 6 %
Hematocrit: 37.1 % (ref 34.0–46.6)
Hemoglobin: 12.4 g/dL (ref 11.1–15.9)
Immature Grans (Abs): 0.1 10*3/uL (ref 0.0–0.1)
Immature Granulocytes: 1 %
Lymphocytes Absolute: 2.4 10*3/uL (ref 0.7–3.1)
Lymphs: 35 %
MCH: 34.3 pg — ABNORMAL HIGH (ref 26.6–33.0)
MCHC: 33.4 g/dL (ref 31.5–35.7)
MCV: 103 fL — ABNORMAL HIGH (ref 79–97)
Monocytes Absolute: 0.5 10*3/uL (ref 0.1–0.9)
Monocytes: 7 %
Neutrophils Absolute: 3.6 10*3/uL (ref 1.4–7.0)
Neutrophils: 50 %
Platelets: 274 10*3/uL (ref 150–450)
RBC: 3.62 x10E6/uL — ABNORMAL LOW (ref 3.77–5.28)
RDW: 11.8 % (ref 11.7–15.4)
WBC: 7 10*3/uL (ref 3.4–10.8)

## 2023-07-04 LAB — COMPREHENSIVE METABOLIC PANEL
ALT: 40 [IU]/L — ABNORMAL HIGH (ref 0–32)
AST: 34 [IU]/L (ref 0–40)
Albumin: 4.3 g/dL (ref 3.9–4.9)
Alkaline Phosphatase: 93 [IU]/L (ref 44–121)
BUN/Creatinine Ratio: 14 (ref 12–28)
BUN: 10 mg/dL (ref 8–27)
Bilirubin Total: 0.3 mg/dL (ref 0.0–1.2)
CO2: 26 mmol/L (ref 20–29)
Calcium: 9.2 mg/dL (ref 8.7–10.3)
Chloride: 98 mmol/L (ref 96–106)
Creatinine, Ser: 0.71 mg/dL (ref 0.57–1.00)
Globulin, Total: 2.4 g/dL (ref 1.5–4.5)
Glucose: 98 mg/dL (ref 70–99)
Potassium: 4.4 mmol/L (ref 3.5–5.2)
Sodium: 138 mmol/L (ref 134–144)
Total Protein: 6.7 g/dL (ref 6.0–8.5)
eGFR: 94 mL/min/{1.73_m2} (ref >59)

## 2023-07-04 LAB — T4, FREE: Free T4: 1.1 ng/dL (ref 0.82–1.77)

## 2023-07-04 LAB — T3: T3, Total: 85 ng/dL (ref 71–180)

## 2023-07-04 LAB — VITAMIN D 25 HYDROXY (VIT D DEFICIENCY, FRACTURES): Vit D, 25-Hydroxy: 59 ng/mL (ref 30.0–100.0)

## 2023-07-04 LAB — INSULIN, RANDOM: INSULIN: 3.3 u[IU]/mL (ref 2.6–24.9)

## 2023-07-22 ENCOUNTER — Other Ambulatory Visit: Payer: Self-pay | Admitting: Internal Medicine

## 2023-07-22 ENCOUNTER — Ambulatory Visit (INDEPENDENT_AMBULATORY_CARE_PROVIDER_SITE_OTHER): Payer: Medicare Other | Admitting: Family Medicine

## 2023-07-22 ENCOUNTER — Encounter (INDEPENDENT_AMBULATORY_CARE_PROVIDER_SITE_OTHER): Payer: Self-pay | Admitting: Family Medicine

## 2023-07-22 VITALS — BP 112/70 | HR 67 | Temp 97.9°F | Ht 65.0 in | Wt 182.0 lb

## 2023-07-22 DIAGNOSIS — E669 Obesity, unspecified: Secondary | ICD-10-CM | POA: Diagnosis not present

## 2023-07-22 DIAGNOSIS — E559 Vitamin D deficiency, unspecified: Secondary | ICD-10-CM

## 2023-07-22 DIAGNOSIS — K760 Fatty (change of) liver, not elsewhere classified: Secondary | ICD-10-CM

## 2023-07-22 DIAGNOSIS — E039 Hypothyroidism, unspecified: Secondary | ICD-10-CM

## 2023-07-22 DIAGNOSIS — Z6830 Body mass index (BMI) 30.0-30.9, adult: Secondary | ICD-10-CM

## 2023-07-22 NOTE — Assessment & Plan Note (Signed)
 TSH 4.49 (0.01 below highest range of normal).  Her MCV is elevated which could be related to this.  Will repeat TSH in 3 months.  No cold intolerance, heat intolerance or palpitations.

## 2023-07-22 NOTE — Assessment & Plan Note (Signed)
 Discussed importance of vitamin d supplementation.  Vitamin d supplementation has been shown to decrease fatigue, decrease risk of progression to insulin resistance and then prediabetes, decreases risk of falling in older age and can even assist in decreasing depressive symptoms in PTSD.   Prescription for Vitamin D sent in.

## 2023-07-22 NOTE — Progress Notes (Signed)
 SUBJECTIVE:  Chief Complaint: Obesity  Interim History: First follow up today.  First few weeks were not too difficult.  She didn't find following the plan to be difficult to fit into the holidays.  Her family visited for the holidays.  Family came for Christmas and some others came for New Years. Next few weeks look more like her normal routine.  She had more than enough to eat on plan.  She may even miss a lunch if she eats the Wheaties protein cereal in the am.  She has been eating a sandwich if she does eat lunch.  Not always able to get the full portion in at breakfast and dinner (particularly at dinner if she does eat lunch).  She is able to finis the sandwich at lunch. Doesn't feel like she did great on eating fruit. She isn't sure if the meal plan is sustainable and is wondering if there is a way to see faster weight loss.  She is wondering about weight loss medications.  Diana House is here to discuss her progress with her obesity treatment plan. She is on the Category 2 Plan and states she is following her eating plan approximately 75 % of the time. She states she is exercising.  OBJECTIVE: Visit Diagnoses: Problem List Items Addressed This Visit       Digestive   Hepatic steatosis   Labs done at last appointment discussed with patient.  ALT has been consistently elevated while AST has been labile.  Discussed other causes of transaminitis as well as pathophysiologic progression to steatosis.  Patient will continue current meal plan and we will recheck LFTs in 3-6 months.        Endocrine   Hypothyroidism   TSH 4.49 (0.01 below highest range of normal).  Her MCV is elevated which could be related to this.  Will repeat TSH in 3 months.  No cold intolerance, heat intolerance or palpitations.        Other   Vitamin D  deficiency - Primary   Discussed importance of vitamin d  supplementation.  Vitamin d  supplementation has been shown to decrease fatigue, decrease risk of progression to  insulin  resistance and then prediabetes, decreases risk of falling in older age and can even assist in decreasing depressive symptoms in PTSD.   Prescription for Vitamin D  sent in.        Other Visit Diagnoses       Obesity (BMI 30-39.9), Starting BMI 30.4         BMI 30.0-30.9,adult           No data recorded No data recorded No data recorded No data recorded   ASSESSMENT AND PLAN:  Diet: Matisse is currently in the action stage of change. As such, her goal is to continue with weight loss efforts. She has agreed to Category 2 Plan.  Exercise: Jessamyn has been instructed to work up to a goal of 150 minutes of combined cardio and strengthening exercise per week for weight loss and overall health benefits.   Behavior Modification:  We discussed the following Behavioral Modification Strategies today: increasing lean protein intake, increasing vegetables, meal planning and cooking strategies, better snacking choices, avoiding temptations, and planning for success.   No follow-ups on file.SABRA She was informed of the importance of frequent follow up visits to maximize her success with intensive lifestyle modifications for her multiple health conditions.  Attestation Statements:   Reviewed by clinician on day of visit: allergies, medications, problem list, medical history, surgical history,  family history, social history, and previous encounter notes.    Adelita Cho, MD

## 2023-07-29 NOTE — Assessment & Plan Note (Signed)
 Labs done at last appointment discussed with patient.  ALT has been consistently elevated while AST has been labile.  Discussed other causes of transaminitis as well as pathophysiologic progression to steatosis.  Patient will continue current meal plan and we will recheck LFTs in 3-6 months.

## 2023-08-12 ENCOUNTER — Ambulatory Visit (INDEPENDENT_AMBULATORY_CARE_PROVIDER_SITE_OTHER): Payer: Medicare Other | Admitting: Family Medicine

## 2023-08-12 VITALS — BP 118/68 | HR 62 | Temp 97.8°F | Ht 65.0 in | Wt 178.0 lb

## 2023-08-12 DIAGNOSIS — Z6829 Body mass index (BMI) 29.0-29.9, adult: Secondary | ICD-10-CM

## 2023-08-12 DIAGNOSIS — E669 Obesity, unspecified: Secondary | ICD-10-CM | POA: Diagnosis not present

## 2023-08-12 DIAGNOSIS — E559 Vitamin D deficiency, unspecified: Secondary | ICD-10-CM

## 2023-08-12 NOTE — Assessment & Plan Note (Signed)
Patient is on OTC vitamin D.  She is on 2k international units- 3 capsules daily.  Was told she could transition to a 5k option and only do 1 capsule daily. [facility] outpatient pharmacy list given today.

## 2023-08-12 NOTE — Progress Notes (Signed)
   SUBJECTIVE:  Chief Complaint: Obesity  Interim History: Patient is appreciative of being retired with the recent weather.  She has some questions about about her last after visit summary.  She hasn't been consistent in any movement outside of her NEAT.  She has been consistent with her meal plan but mentions once in a two week period she may have a salad at a restaurant.  She isn't necessarily doing the sandwich at lunch but often doing meat roll ups.  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  Takyia is here to discuss her progress with her obesity treatment plan. She is on the Category 2 Plan and states she is following her eating plan approximately 99 % of the time. She states she is not exercising.   OBJECTIVE: Visit Diagnoses: Problem List Items Addressed This Visit       Other   Vitamin D deficiency - Primary   Patient is on OTC vitamin D.  She is on 2k international units- 3 capsules daily.  Was told she could transition to a 5k option and only do 1 capsule daily. [facility] outpatient pharmacy list given today.         Vitals Temp: 97.8 F (36.6 C) BP: 118/68 Pulse Rate: 62 SpO2: 99 %   Anthropometric Measurements Height: 5\' 5"  (1.651 m) Weight: 178 lb (80.7 kg) BMI (Calculated): 29.62 Weight at Last Visit: 182 lb Weight Lost Since Last Visit: 4 Weight Gained Since Last Visit: 0 Starting Weight: 185 lb Total Weight Loss (lbs): 7 lb (3.175 kg)   Body Composition  Body Fat %: 40.4 % Fat Mass (lbs): 72.2 lbs Muscle Mass (lbs): 101 lbs Total Body Water (lbs): 68.2 lbs Visceral Fat Rating : 11   Other Clinical Data Today's Visit #: 3 Starting Date: 07/02/23     ASSESSMENT AND PLAN:  Diet: Ilamae is currently in the action stage of change. As such, her goal is to continue with weight loss efforts. She has agreed to Category 2 Plan.  Exercise: Hadia has been instructed that some exercise is better than none for weight loss and overall health benefits.  Between now and the  next appointment patient will start making plan for physical activity.   Behavior Modification:  We discussed the following Behavioral Modification Strategies today: increasing lean protein intake, increasing vegetables, no skipping meals, meal planning and cooking strategies, and better snacking choices.  Patient has not felt deprived in terms of her food availability.  She has gotten used to the meal plan.   No follow-ups on file.Marland Kitchen She was informed of the importance of frequent follow up visits to maximize her success with intensive lifestyle modifications for her multiple health conditions.  Attestation Statements:   Reviewed by clinician on day of visit: allergies, medications, problem list, medical history, surgical history, family history, social history, and previous encounter notes.     Reuben Likes, MD

## 2023-09-09 ENCOUNTER — Ambulatory Visit (INDEPENDENT_AMBULATORY_CARE_PROVIDER_SITE_OTHER): Payer: Medicare Other | Admitting: Family Medicine

## 2023-09-09 ENCOUNTER — Encounter (INDEPENDENT_AMBULATORY_CARE_PROVIDER_SITE_OTHER): Payer: Self-pay | Admitting: Family Medicine

## 2023-09-09 VITALS — BP 120/60 | HR 65 | Temp 98.3°F | Ht 65.0 in | Wt 175.0 lb

## 2023-09-09 DIAGNOSIS — I10 Essential (primary) hypertension: Secondary | ICD-10-CM | POA: Diagnosis not present

## 2023-09-09 DIAGNOSIS — Z6829 Body mass index (BMI) 29.0-29.9, adult: Secondary | ICD-10-CM

## 2023-09-09 DIAGNOSIS — E559 Vitamin D deficiency, unspecified: Secondary | ICD-10-CM

## 2023-09-09 DIAGNOSIS — E669 Obesity, unspecified: Secondary | ICD-10-CM

## 2023-09-09 MED ORDER — HYDROCHLOROTHIAZIDE 25 MG PO TABS: 12.5000 mg | ORAL_TABLET | Freq: Every day | ORAL | Status: DC

## 2023-09-09 NOTE — Progress Notes (Signed)
 SUBJECTIVE:  Chief Complaint: Obesity  Interim History: Patient is staying consistent with her meal plan over the last few weeks.  She is wishing the weight would drop faster.  Has not been able to get all the food in consistently.  She always eats breakfast as wheaties and she looks forward to that.  Sometimes she will eat [place] or ham rolled up occasionally with a piece of bread.  Her husband cooks and she finds most of the food bland.  She can eat a patty of meat and maybe some roasted vegetables for supper.   Diana House is here to discuss her progress with her obesity treatment plan. She is on the Category 2 Plan and states she is following her eating plan approximately 100 % of the time. She states she is walking 30 minutes 2 times per week.   OBJECTIVE: Visit Diagnoses: Problem List Items Addressed This Visit       Cardiovascular and Mediastinum   Hypertension - Primary   Patient blood pressure well controlled today.  She is on toprol, hydrochlorothiazide.  No chest pain, chest pressure or headache. Can decrease hydrochlorothiazide to half a tab daily.      Relevant Medications   hydrochlorothiazide (HYDRODIURIL) 25 MG tablet     Other   Vitamin D deficiency   Patient on OTC Vitamin D- no change to dose at this time.      Obesity (BMI 30-39.9)   Other Clinical Data Today's Visit #: 4 Starting Date: 07/02/23 Comments: Cat 2  Anthropometric Measurements Height: 5\' 5"  (1.651 m) Weight: 175 lb (79.4 kg) BMI (Calculated): 29.12 Weight at Last Visit: 178 lb Weight Lost Since Last Visit: 3 Weight Gained Since Last Visit: 0 Starting Weight: 185 lb Total Weight Loss (lbs): 10 lb (4.536 kg)  Body Composition  Body Fat %: 40.2 % Fat Mass (lbs): 70.6 lbs Muscle Mass (lbs): 99.4 lbs Total Body Water (lbs): 68 lbs Visceral Fat Rating : 11  Continue current meal plan- no changes needed per patient.      Other Visit Diagnoses       BMI 29.0-29.9,adult           No  data recorded      09/09/2023    2:00 PM 08/12/2023   11:00 AM 07/22/2023   11:00 AM  Vitals with BMI  Height 5\' 5"  5\' 5"  5\' 5"   Weight 175 lbs 178 lbs 182 lbs  BMI 29.12 29.62 30.29  Systolic 120 118 161  Diastolic 60 68 70  Pulse 65 62 67       ASSESSMENT AND PLAN:  Diet: Ramatoulaye is currently in the action stage of change. As such, her goal is to continue with weight loss efforts and has agreed to the Category 2 Plan.   Exercise:  Older adults should determine their level of effort for physical activity relative to their level of fitness.  She is going to continue walking 2x a week until next appointment- goal will be to add an additional day after next appointment.  Behavior Modification:  We discussed the following Behavioral Modification Strategies today: increasing lean protein intake, increasing vegetables, meal planning and cooking strategies, and planning for success.   No follow-ups on file.Marland Kitchen She was informed of the importance of frequent follow up visits to maximize her success with intensive lifestyle modifications for her multiple health conditions.  Attestation Statements:   Reviewed by clinician on day of visit: allergies, medications, problem list, medical history, surgical history, family  history, social history, and previous encounter notes.     Reuben Likes, MD

## 2023-09-09 NOTE — Assessment & Plan Note (Signed)
 Patient blood pressure well controlled today.  She is on toprol, hydrochlorothiazide.  No chest pain, chest pressure or headache. Can decrease hydrochlorothiazide to half a tab daily.

## 2023-09-16 ENCOUNTER — Other Ambulatory Visit: Payer: Self-pay | Admitting: Obstetrics and Gynecology

## 2023-09-16 DIAGNOSIS — R928 Other abnormal and inconclusive findings on diagnostic imaging of breast: Secondary | ICD-10-CM

## 2023-09-16 DIAGNOSIS — E669 Obesity, unspecified: Secondary | ICD-10-CM | POA: Insufficient documentation

## 2023-09-16 NOTE — Assessment & Plan Note (Signed)
 Other Clinical Data Today's Visit #: 4 Starting Date: 07/02/23 Comments: Cat 2  Anthropometric Measurements Height: 5\' 5"  (1.651 m) Weight: 175 lb (79.4 kg) BMI (Calculated): 29.12 Weight at Last Visit: 178 lb Weight Lost Since Last Visit: 3 Weight Gained Since Last Visit: 0 Starting Weight: 185 lb Total Weight Loss (lbs): 10 lb (4.536 kg)  Body Composition  Body Fat %: 40.2 % Fat Mass (lbs): 70.6 lbs Muscle Mass (lbs): 99.4 lbs Total Body Water (lbs): 68 lbs Visceral Fat Rating : 11  Continue current meal plan- no changes needed per patient.

## 2023-09-16 NOTE — Assessment & Plan Note (Signed)
 Patient on OTC Vitamin D- no change to dose at this time.

## 2023-09-25 ENCOUNTER — Ambulatory Visit
Admission: RE | Admit: 2023-09-25 | Discharge: 2023-09-25 | Disposition: A | Discharge status: Home / Self Care | Source: Ambulatory Visit | Attending: Obstetrics and Gynecology | Admitting: Obstetrics and Gynecology

## 2023-09-25 DIAGNOSIS — R928 Other abnormal and inconclusive findings on diagnostic imaging of breast: Secondary | ICD-10-CM

## 2023-09-26 ENCOUNTER — Other Ambulatory Visit: Payer: Self-pay | Admitting: Obstetrics and Gynecology

## 2023-09-26 DIAGNOSIS — N6310 Unspecified lump in the right breast, unspecified quadrant: Secondary | ICD-10-CM

## 2023-10-01 ENCOUNTER — Ambulatory Visit (INDEPENDENT_AMBULATORY_CARE_PROVIDER_SITE_OTHER): Payer: Medicare Other | Admitting: Physician Assistant

## 2023-10-07 LAB — HM DEXA SCAN: HM Dexa Scan: NORMAL

## 2023-10-10 ENCOUNTER — Ambulatory Visit (INDEPENDENT_AMBULATORY_CARE_PROVIDER_SITE_OTHER): Admitting: Family Medicine

## 2023-10-10 ENCOUNTER — Encounter (INDEPENDENT_AMBULATORY_CARE_PROVIDER_SITE_OTHER): Payer: Self-pay | Admitting: Family Medicine

## 2023-10-10 VITALS — BP 108/69 | HR 61 | Temp 97.8°F | Ht 65.0 in | Wt 172.0 lb

## 2023-10-10 DIAGNOSIS — I10 Essential (primary) hypertension: Secondary | ICD-10-CM | POA: Diagnosis not present

## 2023-10-10 DIAGNOSIS — Z6828 Body mass index (BMI) 28.0-28.9, adult: Secondary | ICD-10-CM

## 2023-10-10 DIAGNOSIS — E669 Obesity, unspecified: Secondary | ICD-10-CM | POA: Diagnosis not present

## 2023-10-10 NOTE — Assessment & Plan Note (Signed)
 On hydrochlorothiazide and toprol daily.  No chest pain, chest pressure or headache.  No refills needed.

## 2023-10-10 NOTE — Progress Notes (Signed)
   SUBJECTIVE:  Chief Complaint: Obesity  Interim History: Patient is in the process of a massive home remodel and is tearing down and renovating most of her house.  She and her husband are doing the demo work themselves. She has been getting a significant amount of physical activity in. She has not done as well as she wanted to on her food plan.  She is wondering about other proteins she can eat.  She is somewhat tired of eating meat protein.  She would like to know about protein bars.  Diana House is here to discuss her progress with her obesity treatment plan. She is on the Category 2 Plan and states she is following her eating plan approximately 90 % of the time. She states she is walking 30 minutes 3 times per week and doing demo for renovations.   OBJECTIVE: Visit Diagnoses: Problem List Items Addressed This Visit       Cardiovascular and Mediastinum   Hypertension - Primary   On hydrochlorothiazide and toprol daily.  No chest pain, chest pressure or headache.  No refills needed.        Other   Obesity (BMI 30-39.9)   Anthropometric Measurements Height: 5\' 5"  (1.651 m) Weight: 172 lb (78 kg) BMI (Calculated): 28.62 Weight at Last Visit: 175 lb Weight Lost Since Last Visit: 3 Weight Gained Since Last Visit: 0 Starting Weight: 185 lb Total Weight Loss (lbs): 13 lb (5.897 kg) Body Composition  Body Fat %: 39.7 % Fat Mass (lbs): 68.6 lbs Muscle Mass (lbs): 98.8 lbs Total Body Water (lbs): 69 lbs Visceral Fat Rating : 10 Other Clinical Data Today's Visit #: 5 Starting Date: 07/02/23 Comments: Cat 2       Other Visit Diagnoses       BMI 28.0-28.9,adult           No data recorded     10/10/2023    2:00 PM 09/09/2023    2:00 PM 08/12/2023   11:00 AM  Vitals with BMI  Height 5\' 5"  5\' 5"  5\' 5"   Weight 172 lbs 175 lbs 178 lbs  BMI 28.62 29.12 29.62  Systolic 108 120 161  Diastolic 69 60 68  Pulse 61 65 62        ASSESSMENT AND PLAN:  Diet: Diana House is currently  in the action stage of change. As such, her goal is to continue with weight loss efforts and has agreed to the Category 2 Plan and keeping a food journal and adhering to recommended goals of 200-250 calories and 20 grams of protein for breakfast, 300-350 calories and 25 grams of protein for lunch and 400-450 calories and 35 or more grams of protein for supper.   Exercise:  Older adults should follow the adult guidelines. When older adults cannot meet the adult guidelines, they should be as physically active as their abilities and conditions will allow.  Behavior Modification:  We discussed the following Behavioral Modification Strategies today: increasing lean protein intake, increasing vegetables, meal planning and cooking strategies, and planning for success.   Return in about 4 weeks (around 11/07/2023).Marland Kitchen She was informed of the importance of frequent follow up visits to maximize her success with intensive lifestyle modifications for her multiple health conditions.  Attestation Statements:   Reviewed by clinician on day of visit: allergies, medications, problem list, medical history, surgical history, family history, social history, and previous encounter notes.    Reuben Likes, MD

## 2023-10-22 ENCOUNTER — Other Ambulatory Visit: Payer: Medicare Other

## 2023-10-22 DIAGNOSIS — E781 Pure hyperglyceridemia: Secondary | ICD-10-CM

## 2023-10-22 DIAGNOSIS — E039 Hypothyroidism, unspecified: Secondary | ICD-10-CM

## 2023-10-22 DIAGNOSIS — I10 Essential (primary) hypertension: Secondary | ICD-10-CM

## 2023-10-22 LAB — CBC WITH DIFFERENTIAL/PLATELET
Absolute Lymphocytes: 1956 {cells}/uL (ref 850–3900)
Absolute Monocytes: 396 {cells}/uL (ref 200–950)
Basophils Absolute: 60 {cells}/uL (ref 0–200)
Basophils Relative: 1 %
Eosinophils Absolute: 444 {cells}/uL (ref 15–500)
Eosinophils Relative: 7.4 %
HCT: 37.5 % (ref 35.0–45.0)
Hemoglobin: 12.5 g/dL (ref 11.7–15.5)
MCH: 34.2 pg — ABNORMAL HIGH (ref 27.0–33.0)
MCHC: 33.3 g/dL (ref 32.0–36.0)
MCV: 102.5 fL — ABNORMAL HIGH (ref 80.0–100.0)
MPV: 11.8 fL (ref 7.5–12.5)
Monocytes Relative: 6.6 %
Neutro Abs: 3144 {cells}/uL (ref 1500–7800)
Neutrophils Relative %: 52.4 %
Platelets: 257 10*3/uL (ref 140–400)
RBC: 3.66 10*6/uL — ABNORMAL LOW (ref 3.80–5.10)
RDW: 11.9 % (ref 11.0–15.0)
Total Lymphocyte: 32.6 %
WBC: 6 10*3/uL (ref 3.8–10.8)

## 2023-10-22 LAB — COMPLETE METABOLIC PANEL WITHOUT GFR
AG Ratio: 1.9 (calc) (ref 1.0–2.5)
ALT: 35 U/L — ABNORMAL HIGH (ref 6–29)
AST: 30 U/L (ref 10–35)
Albumin: 4.3 g/dL (ref 3.6–5.1)
Alkaline phosphatase (APISO): 69 U/L (ref 37–153)
BUN: 14 mg/dL (ref 7–25)
CO2: 34 mmol/L — ABNORMAL HIGH (ref 20–32)
Calcium: 9.6 mg/dL (ref 8.6–10.4)
Chloride: 100 mmol/L (ref 98–110)
Creat: 0.6 mg/dL (ref 0.50–1.05)
Globulin: 2.3 g/dL (ref 1.9–3.7)
Glucose, Bld: 102 mg/dL — ABNORMAL HIGH (ref 65–99)
Potassium: 4.3 mmol/L (ref 3.5–5.3)
Sodium: 140 mmol/L (ref 135–146)
Total Bilirubin: 0.4 mg/dL (ref 0.2–1.2)
Total Protein: 6.6 g/dL (ref 6.1–8.1)

## 2023-10-22 LAB — LIPID PANEL
Cholesterol: 161 mg/dL (ref <200)
HDL: 58 mg/dL (ref >=50)
LDL Cholesterol (Calc): 81 mg/dL
Non-HDL Cholesterol (Calc): 103 mg/dL (ref <130)
Total CHOL/HDL Ratio: 2.8 (calc) (ref <5.0)
Triglycerides: 120 mg/dL (ref <150)

## 2023-10-22 LAB — TSH: TSH: 0.33 m[IU]/L — ABNORMAL LOW (ref 0.40–4.50)

## 2023-10-22 NOTE — Progress Notes (Signed)
 Annual Wellness Visit   Patient Care Team: Sylvan Evener, MD as PCP - General (Internal Medicine)  Visit Date: 10/24/23   Chief Complaint  Patient presents with   Medicare Wellness   Annual Exam   Subjective:  Patient: Diana House, Female DOB: [birth year], 68 y.o. MRN: [id number] Diana House is a 68 y.o. Female who presents today for her Annual Wellness Visit. Patient has history of Depression; Hypothyroidism; Vitamin-D Deficiency; Hypertension; Fibrocystic Breast Disease; History of Melanoma; GE Reflux; Cervical Disc Disease; Hepatic Steatosis; Hyperglycemia; and Obesity (BMI 30-39.9).  History of Hypertension treated with Metoprolol  succinate 25 mg daily and HCTZ 12.5 mg daily. Blood Pressure: normotensive today at 130/82.  History of Anxiety/Depression; Insomnia treated with Xanax  0.25 mg up to three times daily as needed, Wellbutrin -XL 300 mg daily, Lexapro  10 mg daily, and Ambien 10 mg at bedtime  History of Hypothyroidism treated with Levothyroxine  150 mg daily. Has low TSH on this dose so need a lower dose.  History of Musculoskeletal Pain treated with Norco 10-325 mg as needed every 6 hours.   History of Hepatic Steatosis noted in 2014 on an abdominal CT. 10/19/2022 ALT 35, which is significantly decreased from 102, and has been elevated fairly consistently in the past.  History of Vitamin-D Deficiency treated with Vitamin-D 6000 units daily - 1 tablet in the morning and 2 in the evening.  Labs 10/22/2023 CBC, compared to 10/2022: RBC 3.66, decreased from 4.02; MCV 102.5, elevated from 99; MCH 34.2, elevated from 33.6; otherwise WNL.   CMP, compared to 10/19/2022: ALT 35, decreased from 102; Glucose 102, decreased from 107; Co2 34, elevated from 33; otherwise WNL.    Lipid Panel: WNL  TSH, compared to 10/19/2022: 0.33, decreased from 1.39. Had been trending upwards from 1.54 at the end of April to 3.60 in October and 4.49 in November.   PAP Smear 09/07/2022 though [facility].  History of Fibrocystic Breast Disease with initial Mammogram 09/10/2023 through [facility]; follow-up Unilateral Right Breast found 2 probably benign complicated cysts at 9 o'clock and 11 o'clock positions with repeat recommendation of 6 months (03/2024).  Colonoscopy 02/12/2023 removed 7 Total Polyps, sessile (per pathology 6 tubular adenomas w/o high grade dysplasia or malignancy and 1 hyperplastic polyp w/o dysplasia or malignancy) - six 4-6 mm from rectum, transverse, ascending colon, and cecum with one other from cecum measuring 14 mm; Scattered Small-mouthed Diverticula in the sigmoid colon; Non-bleeding Medium External & Internal Hemorrhoids found during retroflexion with repeat recommendation of 2027.  Bone Density 10/07/2023 through [facility].   Vaccine Counseling: UTD on Flu, Shingles 2/2, PNA, and Tdap. Past Medical History:  Diagnosis Date   Anxiety    on meds   Chronic pain    Degenerative disc disease, cervical and lumbar    Depression    on meds   Fatty liver    Fibrocystic breast disease    GERD (gastroesophageal reflux disease)    hx of   Hypertension    on meds   Hypothyroid    Nail fungus    Palpitations    Seasonal allergies    Skin cancer    Swallowing difficulty    Thyroid  disease    on meds   Vitamin D  deficiency   Medical/Surgical History Narrative:  Allergic/Intolerant to: Biaxin - rash  2022 - in April had ACDF C4-5 & C7-T1 w/ removal of previously placed plate. Following surgery was the resolution of right hand  numbness and weakness.  2018 - In January had Lumbar Microdiscectomy L5-S1 by Dr. Nigel Bart.   2005 - Uterine Ablation by Dr. Andree Kayser. Melanoma, Back removed.   2000 - Cervical Disc Surgery at C5-C6-C7 in August for Cervical Disc Disease.   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  1983 & 1988 - Surgery for Strabismus at 68 y.o and 68 y.o  Other - Shx of: Tonsillectomy Family History  Problem Relation Age of Onset    Hyperlipidemia Mother    Heart disease Mother    Obesity Mother    Obesity Father    Cancer Father    Hyperlipidemia Father    Diabetes Father    Heart disease Father    Hypertension Father    Colon polyps Father 78  Family History Narrative: Father w/ hx of Coronary Artery Disease, Diabetes, CABG, and Hypertension Mother w/ hx of Hypertension, Hyperlipidemia, Hypothyroidism, Heart Failure, Chronic Anti-coagulation Use for DVT, hx of Hip Fracture & Leg Length Uncle w/ Liver Cancer 3 Brothers - 2 w/ Liver Issues 1 Sister w/ Liver Issues Social History   Social History Narrative   RN, 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 Married. Social Alcohol Consumption   2025 - Remodeling her house, currently staying in an Air BnB in the interim.    Review of Systems  Constitutional:  Negative for chills, fever, malaise/fatigue and weight loss.  HENT:  Negative for hearing loss, sinus pain and sore throat.   Respiratory:  Negative for cough, hemoptysis and shortness of breath.   Cardiovascular:  Negative for chest pain, palpitations, leg swelling and PND.  Gastrointestinal:  Negative for abdominal pain, constipation, diarrhea, heartburn, nausea and vomiting.  Genitourinary:  Negative for dysuria, frequency and urgency.  Musculoskeletal:  Negative for back pain, myalgias and neck pain.  Skin:  Negative for itching and rash.  Neurological:  Negative for dizziness, tingling, seizures and headaches.  Endo/Heme/Allergies:  Negative for polydipsia.  Psychiatric/Behavioral:  Negative for depression. The patient is not nervous/anxious.     Objective:  Vitals: BP 130/82   Pulse (!) 59   Temp 97.7 F (36.5 C) (Temporal)   Ht 5' 5.5" (1.664 m)   Wt 174 lb 12.8 oz (79.3 kg)   SpO2 95%   BMI 28.65 kg/m  Physical Exam Vitals and nursing note reviewed.  Constitutional:      General: She is not in acute distress.    Appearance: Normal appearance. She is not  ill-appearing or toxic-appearing.  HENT:     Head: Normocephalic and atraumatic.     Right Ear: Hearing, tympanic membrane, ear canal and external ear normal.     Left Ear: Hearing, tympanic membrane, ear canal and external ear normal.     Mouth/Throat:     Pharynx: Oropharynx is clear.  Eyes:     Extraocular Movements: Extraocular movements intact.     Pupils: Pupils are equal, round, and reactive to light.  Neck:     Thyroid : No thyroid  mass, thyromegaly or thyroid  tenderness.     Vascular: No carotid bruit.  Cardiovascular:     Rate and Rhythm: Normal rate and regular rhythm. No extrasystoles are present.    Pulses:          Dorsalis pedis pulses are 2+ on the right side and 2+ on the left side.     Heart sounds: Normal heart sounds. No murmur heard.    No friction rub. No gallop.  Pulmonary:  Effort: Pulmonary effort is normal.     Breath sounds: Normal breath sounds. No decreased breath sounds, wheezing, rhonchi or rales.  Chest:     Chest wall: No mass.  Abdominal:     Palpations: Abdomen is soft. There is no hepatomegaly, splenomegaly or mass.     Tenderness: There is no abdominal tenderness.     Hernia: No hernia is present.  Musculoskeletal:     Cervical back: Normal range of motion.     Right lower leg: No edema.     Left lower leg: No edema.  Lymphadenopathy:     Cervical: No cervical adenopathy.     Upper Body:     Right upper body: No supraclavicular adenopathy.     Left upper body: No supraclavicular adenopathy.  Skin:    General: Skin is warm and dry.  Neurological:     General: No focal deficit present.     Mental Status: She is alert and oriented to person, place, and time. Mental status is at baseline.     Sensory: Sensation is intact.     Motor: Motor function is intact. No weakness.     Deep Tendon Reflexes: Reflexes are normal and symmetric.  Psychiatric:        Attention and Perception: Attention normal.        Mood and Affect: Mood normal.         Speech: Speech normal.        Behavior: Behavior normal.        Thought Content: Thought content normal.        Cognition and Memory: Cognition normal.        Judgment: Judgment normal.    Most Recent Functional Status Assessment:    10/24/2023    3:11 PM  In your present state of health, do you have any difficulty performing the following activities:  Hearing? 0  Vision? 1  Comment Patient states Kind of sort of  Difficulty concentrating or making decisions? 1  Walking or climbing stairs? 0  Dressing or bathing? 0  Doing errands, shopping? 0  Preparing Food and eating ? N  Using the Toilet? N  In the past six months, have you accidently leaked urine? Y  Do you have problems with loss of bowel control? N  Managing your Medications? N  Managing your Finances? N  Housekeeping or managing your Housekeeping? N   Most Recent Fall Risk Assessment:    10/24/2023    3:11 PM  Fall Risk   Falls in the past year? 1  Number falls in past yr: 0  Injury with Fall? 0  Risk for fall due to : No Fall Risks;Other (Comment)  Follow up Falls evaluation completed   Most Recent Depression Screenings:    10/24/2023    3:40 PM 11/06/2022    3:03 PM  PHQ 2/9 Scores  PHQ - 2 Score 1 0   Most Recent Cognitive Screening:    10/24/2023    3:15 PM  6CIT Screen  What Year? 4 points  What month? 3 points  What time? 3 points  Count back from 20 4 points  Months in reverse 4 points  Repeat phrase 10 points  Total Score 28 points   Results:  Studies Obtained And Personally Reviewed By Me:  PAP Smear 09/07/2022 though [facility].  History of Fibrocystic Breast Disease with initial Mammogram 09/10/2023 through [facility]; follow-up Unilateral Right Breast found 2 probably benign complicated cysts  at 9 o'clock and 11 o'clock positions with repeat recommendation of 6 months (03/2024).  Colonoscopy 02/12/2023 removed 7 Total Polyps, sessile (per pathology 6 tubular  adenomas w/o high grade dysplasia or malignancy and 1 hyperplastic polyp w/o dysplasia or malignancy) - six 4-6 mm from rectum, transverse, ascending colon, and cecum with one other from cecum measuring 14 mm; Scattered Small-mouthed Diverticula in the sigmoid colon; Non-bleeding Medium External & Internal Hemorrhoids found during retroflexion with repeat recommendation of 2027.  Bone Density 10/07/2023 through [facility].   Labs:     Component Value Date/Time   NA 140 10/22/2023 0951   NA 138 07/02/2023 0938   K 4.3 10/22/2023 0951   CL 100 10/22/2023 0951   CO2 34 (H) 10/22/2023 0951   GLUCOSE 102 (H) 10/22/2023 0951   BUN 14 10/22/2023 0951   BUN 10 07/02/2023 0938   CREATININE 0.60 10/22/2023 0951   CALCIUM 9.6 10/22/2023 0951   PROT 6.6 10/22/2023 0951   PROT 6.7 07/02/2023 0938   ALBUMIN 4.3 07/02/2023 0938   AST 30 10/22/2023 0951   ALT 35 (H) 10/22/2023 0951   ALKPHOS 93 07/02/2023 0938   BILITOT 0.4 10/22/2023 0951   BILITOT 0.3 07/02/2023 0938   GFRNONAA 94 09/27/2020 1000   GFRAA 109 09/27/2020 1000    Lab Results  Component Value Date   WBC 6.0 10/22/2023   HGB 12.5 10/22/2023   HCT 37.5 10/22/2023   MCV 102.5 (H) 10/22/2023   PLT 257 10/22/2023   Lab Results  Component Value Date   CHOL 161 10/22/2023   HDL 58 10/22/2023   LDLCALC 81 10/22/2023   TRIG 120 10/22/2023   CHOLHDL 2.8 10/22/2023   Lab Results  Component Value Date   HGBA1C 5.4 04/25/2023    Lab Results  Component Value Date   TSH 0.33 (L) 10/22/2023    Assessment & Plan:  Other Labs Reviewed today: CBC, compared to 10/2022: RBC 3.66, decreased from 4.02; MCV 102.5, elevated from 99; MCH 34.2, elevated from 33.6; otherwise WNL.  CMP, compared to 10/19/2022: ALT 35, decreased from 102; Glucose 102, decreased from 107; Co2 34, elevated from 33; otherwise WNL.   Lipid Panel: WNL  Medical wellness visit and health maintenance exams done today.  Hypertension treated with Metoprolol   succinate 25 mg daily and HCTZ 12.5 mg daily. Blood Pressure: normotensive today at 130/82.  Anxiety/Depression; Insomnia treated with Xanax  0.25 mg up to three times daily as needed, Wellbutrin -XL 300 mg daily, Lexapro  10 mg daily, and Ambien 10 mg at bedtime  Hypothyroidism treated with Levothyroxine  150 mg daily. TSH, compared to 10/19/2022: 0.33, decreased from 1.39. Had been trending upwards from 1.54 at the end of April to 3.60 in October and 4.49 in November.   Changing to Levothyroxine  137 mg daily due to low TSH and recheck in 6 weeks with TSH. Would like to see TSH about 1.00.  Musculoskeletal Pain treated with Norco 10-325 mg as needed every 6 hours.   Hepatic Steatosis noted in 2014 on an abdominal CT. 10/19/2022 ALT 35, which is significantly decreased from 102, and has been elevated fairly consistently in the past.  Vitamin-D Deficiency treated with Vitamin-D 6000 units daily - 1 tablet in the morning and 2 in the evening.  PAP Smear 09/07/2022 though [facility].  Fibrocystic Breast Disease with initial Mammogram 09/10/2023 through [facility]; follow-up Unilateral Right Breast found 2 probably benign complicated cysts at 9 o'clock and 11 o'clock positions  with repeat recommendation of 6 months (03/2024).  Colonoscopy 02/12/2023 removed 7 Total Polyps, sessile (per pathology 6 tubular adenomas w/o high grade dysplasia or malignancy and 1 hyperplastic polyp w/o dysplasia or malignancy) - six 4-6 mm from rectum, transverse, ascending colon, and cecum with one other from cecum measuring 14 mm; Scattered Small-mouthed Diverticula in the sigmoid colon; Non-bleeding Medium External & Internal Hemorrhoids found during retroflexion with repeat recommendation of 2027.  Bone Density 10/07/2023 through [facility].   Vaccine Counseling: UTD on Flu, Shingles 2/2, PNA, and Tdap.  Return in about 6 weeks (around 12/05/2023) for TSH Recheck .   Annual wellness visit  done today including the all of the following: Reviewed patient's Family Medical History Reviewed and updated list of patient's medical providers Assessment of cognitive impairment was done Assessed patient's functional ability Established a written schedule for health screening services Health Risk Assessent Completed and Reviewed  Discussed health benefits of physical activity, and encouraged her to engage in regular exercise appropriate for her age and condition.    I,Emily Lagle,acting as a Neurosurgeon for Sylvan Evener, MD.,have documented all relevant documentation on the behalf of Sylvan Evener, MD,as directed by  Sylvan Evener, MD while in the presence of Sylvan Evener, MD.   I, Sylvan Evener, MD, have reviewed all documentation for this visit. The documentation on 11/03/23 for the exam, diagnosis, procedures, and orders are all accurate and complete.

## 2023-10-23 ENCOUNTER — Other Ambulatory Visit: Payer: Self-pay | Admitting: Internal Medicine

## 2023-10-23 NOTE — Assessment & Plan Note (Signed)
 Anthropometric Measurements Height: 5\' 5"  (1.651 m) Weight: 172 lb (78 kg) BMI (Calculated): 28.62 Weight at Last Visit: 175 lb Weight Lost Since Last Visit: 3 Weight Gained Since Last Visit: 0 Starting Weight: 185 lb Total Weight Loss (lbs): 13 lb (5.897 kg) Body Composition  Body Fat %: 39.7 % Fat Mass (lbs): 68.6 lbs Muscle Mass (lbs): 98.8 lbs Total Body Water (lbs): 69 lbs Visceral Fat Rating : 10 Other Clinical Data Today's Visit #: 5 Starting Date: 07/02/23 Comments: Cat 2

## 2023-10-24 ENCOUNTER — Other Ambulatory Visit: Payer: Self-pay

## 2023-10-24 ENCOUNTER — Encounter: Payer: Self-pay | Admitting: Internal Medicine

## 2023-10-24 ENCOUNTER — Ambulatory Visit: Payer: Medicare Other | Admitting: Internal Medicine

## 2023-10-24 VITALS — BP 130/82 | HR 59 | Temp 97.7°F | Ht 65.5 in | Wt 174.8 lb

## 2023-10-24 DIAGNOSIS — Z860101 Personal history of adenomatous and serrated colon polyps: Secondary | ICD-10-CM

## 2023-10-24 DIAGNOSIS — Z0000 Encounter for general adult medical examination without abnormal findings: Secondary | ICD-10-CM | POA: Diagnosis not present

## 2023-10-24 DIAGNOSIS — I10 Essential (primary) hypertension: Secondary | ICD-10-CM | POA: Diagnosis not present

## 2023-10-24 DIAGNOSIS — E039 Hypothyroidism, unspecified: Secondary | ICD-10-CM

## 2023-10-24 DIAGNOSIS — G4700 Insomnia, unspecified: Secondary | ICD-10-CM | POA: Diagnosis not present

## 2023-10-24 DIAGNOSIS — N6019 Diffuse cystic mastopathy of unspecified breast: Secondary | ICD-10-CM

## 2023-10-24 DIAGNOSIS — E559 Vitamin D deficiency, unspecified: Secondary | ICD-10-CM

## 2023-10-24 DIAGNOSIS — F419 Anxiety disorder, unspecified: Secondary | ICD-10-CM | POA: Diagnosis not present

## 2023-10-24 DIAGNOSIS — K760 Fatty (change of) liver, not elsewhere classified: Secondary | ICD-10-CM

## 2023-10-24 DIAGNOSIS — M7918 Myalgia, other site: Secondary | ICD-10-CM

## 2023-10-24 DIAGNOSIS — Z87898 Personal history of other specified conditions: Secondary | ICD-10-CM

## 2023-10-24 DIAGNOSIS — Z6828 Body mass index (BMI) 28.0-28.9, adult: Secondary | ICD-10-CM

## 2023-10-24 DIAGNOSIS — Z9889 Other specified postprocedural states: Secondary | ICD-10-CM

## 2023-10-24 DIAGNOSIS — F32A Depression, unspecified: Secondary | ICD-10-CM

## 2023-10-24 DIAGNOSIS — K219 Gastro-esophageal reflux disease without esophagitis: Secondary | ICD-10-CM

## 2023-10-24 DIAGNOSIS — Z85820 Personal history of malignant melanoma of skin: Secondary | ICD-10-CM

## 2023-10-24 MED ORDER — LEVOTHYROXINE SODIUM 137 MCG PO CAPS: ORAL_CAPSULE | ORAL | 1 refills | Status: DC

## 2023-10-28 ENCOUNTER — Telehealth: Payer: Self-pay

## 2023-10-28 NOTE — Telephone Encounter (Signed)
 Spoke to pharmacist at [facility] and told them to fill for tablets.

## 2023-10-28 NOTE — Telephone Encounter (Signed)
 Patients pharmacy wants to know if you want patient on Capsules or tablets, stated she has been on tablets which are cheaper.

## 2023-11-03 ENCOUNTER — Encounter: Payer: Self-pay | Admitting: Internal Medicine

## 2023-11-03 NOTE — Patient Instructions (Addendum)
 It was a pleasure to see you today. Labs are stable except would like to change Levothyoxine to 137 mcg daily as TSH is low on current dose. Let's plan to recheck TSH on empty stomach with no med on board that day in 6 weeks.

## 2023-11-18 ENCOUNTER — Encounter (INDEPENDENT_AMBULATORY_CARE_PROVIDER_SITE_OTHER): Payer: Self-pay | Admitting: Family Medicine

## 2023-11-18 ENCOUNTER — Ambulatory Visit (INDEPENDENT_AMBULATORY_CARE_PROVIDER_SITE_OTHER): Admitting: Family Medicine

## 2023-11-18 VITALS — BP 116/62 | HR 64 | Temp 98.2°F | Ht 65.0 in | Wt 169.0 lb

## 2023-11-18 DIAGNOSIS — E039 Hypothyroidism, unspecified: Secondary | ICD-10-CM | POA: Diagnosis not present

## 2023-11-18 DIAGNOSIS — Z6828 Body mass index (BMI) 28.0-28.9, adult: Secondary | ICD-10-CM | POA: Diagnosis not present

## 2023-11-18 DIAGNOSIS — E669 Obesity, unspecified: Secondary | ICD-10-CM | POA: Diagnosis not present

## 2023-11-18 NOTE — Progress Notes (Signed)
   SUBJECTIVE:  Chief Complaint: Obesity  Interim History: Patient dealing with massive remodel at her house and is moving between [employer].  Her husband had a hip replacement so she is doing all the moving herself.  No clear date of completion for remodel.  Hasn't been getting midday protein in.  She is eating cereal in the am and that is consistent.  Most nights she will get some sort of meat in but its the middle of the day that she just isn't eating.  Biggest obstacle over the next few weeks is hopping between [employer]B.  Sanaai is here to discuss her progress with her obesity treatment plan. She is on the Category 2 Plan and states she is following her eating plan approximately 60 % of the time. She states she is walking and moving.   OBJECTIVE: Visit Diagnoses: Problem List Items Addressed This Visit       Endocrine   Hypothyroidism - Primary   Synthroid  just dropped down to weekly.  No cold intolerance, heat intolerance or palpitations.  Repeat TSH in 6 weeks      Other Visit Diagnoses       Obesity (BMI 30-39.9), Starting BMI 30.4         BMI 28.0-28.9,adult           Vitals Temp: 98.2 F (36.8 C) BP: 116/62 Pulse Rate: 64 SpO2: 99 %   Anthropometric Measurements Height: 5\' 5"  (1.651 m) Weight: 169 lb (76.7 kg) BMI (Calculated): 28.12 Weight at Last Visit: 172 lb Weight Lost Since Last Visit: 3 Weight Gained Since Last Visit: 0 Starting Weight: 185 lb Total Weight Loss (lbs): 16 lb (7.258 kg)   Body Composition  Body Fat %: 38.7 % Fat Mass (lbs): 65.6 lbs Muscle Mass (lbs): 98.8 lbs Total Body Water (lbs): 69.96 lbs Visceral Fat Rating : 10   Other Clinical Data Today's Visit #: 6 Starting Date: 07/02/23 Comments: Cat 2     ASSESSMENT AND PLAN:  Diet: Lalanya is currently in the action stage of change. As such, her goal is to continue with weight loss efforts and has agreed to the Category 2 Plan.   Exercise:  Older adults should follow the  adult guidelines. When older adults cannot meet the adult guidelines, they should be as physically active as their abilities and conditions will allow. and Older adults should determine their level of effort for physical activity relative to their level of fitness.  Behavior Modification:  We discussed the following Behavioral Modification Strategies today: increasing lean protein intake, decreasing simple carbohydrates, meal planning and cooking strategies, keeping healthy foods in the home, avoiding temptations, and planning for success.   Return in about 6 weeks (around 12/30/2023).   She was informed of the importance of frequent follow up visits to maximize her success with intensive lifestyle modifications for her multiple health conditions.  Attestation Statements:   Reviewed by clinician on day of visit: allergies, medications, problem list, medical history, surgical history, family history, social history, and previous encounter notes.     Donaciano Frizzle, MD

## 2023-11-18 NOTE — Assessment & Plan Note (Signed)
 Synthroid  just dropped down to 137mcg weekly.  No cold intolerance, heat intolerance or palpitations.  Repeat TSH in 6 weeks

## 2023-12-10 ENCOUNTER — Other Ambulatory Visit

## 2023-12-10 DIAGNOSIS — E039 Hypothyroidism, unspecified: Secondary | ICD-10-CM

## 2023-12-10 LAB — TSH: TSH: 1.01 m[IU]/L (ref 0.40–4.50)

## 2023-12-11 ENCOUNTER — Ambulatory Visit: Payer: Self-pay | Admitting: Internal Medicine

## 2024-01-01 ENCOUNTER — Ambulatory Visit (INDEPENDENT_AMBULATORY_CARE_PROVIDER_SITE_OTHER): Admitting: Family Medicine

## 2024-01-16 ENCOUNTER — Ambulatory Visit (INDEPENDENT_AMBULATORY_CARE_PROVIDER_SITE_OTHER): Admitting: Family Medicine

## 2024-01-21 ENCOUNTER — Encounter: Payer: Self-pay | Admitting: Obstetrics and Gynecology

## 2024-01-30 ENCOUNTER — Encounter (INDEPENDENT_AMBULATORY_CARE_PROVIDER_SITE_OTHER): Payer: Self-pay | Admitting: Family Medicine

## 2024-01-30 ENCOUNTER — Ambulatory Visit (INDEPENDENT_AMBULATORY_CARE_PROVIDER_SITE_OTHER): Admitting: Family Medicine

## 2024-01-30 VITALS — BP 129/71 | HR 63 | Temp 98.3°F | Ht 65.0 in | Wt 163.0 lb

## 2024-01-30 DIAGNOSIS — E039 Hypothyroidism, unspecified: Secondary | ICD-10-CM | POA: Diagnosis not present

## 2024-01-30 DIAGNOSIS — Z6827 Body mass index (BMI) 27.0-27.9, adult: Secondary | ICD-10-CM | POA: Diagnosis not present

## 2024-01-30 DIAGNOSIS — E669 Obesity, unspecified: Secondary | ICD-10-CM | POA: Diagnosis not present

## 2024-01-30 NOTE — Progress Notes (Signed)
   SUBJECTIVE:  Chief Complaint: Obesity  Interim History: Patient here for follow up.  Since last appointment she finally got back into her house from her air BnB.  They have also gone on vacation and her husband got C. Diff.  Her husband as a whole is getting better.  Foodwise she is not terribly interested in food.  She is eating cereal in the am and a Lean Cuisine at night.  Sometimes for lunch she is doing a yasso or a protein bar.  She is really enjoying her yasso bar. Over the next few weeks she does not have anything significant going on.   Jessicca is here to discuss her progress with her obesity treatment plan. She is on the Category 2 Plan and states she is following her eating plan approximately 70 % of the time. She states she is not exercising.   OBJECTIVE: Visit Diagnoses: Problem List Items Addressed This Visit       Endocrine   Hypothyroidism - Primary   Other Visit Diagnoses       Obesity (BMI 30-39.9), Starting BMI 30.4         BMI 27.0-27.9,adult           Vitals Temp: 98.3 F (36.8 C) BP: 129/71 Pulse Rate: 63 SpO2: 99 %   Anthropometric Measurements Height: 5' 5 (1.651 m) Weight: 163 lb (73.9 kg) BMI (Calculated): 27.12 Weight at Last Visit: 169 lb Weight Lost Since Last Visit: 6 Weight Gained Since Last Visit: 0 Starting Weight: 185 lb Total Weight Loss (lbs): 22 lb (9.979 kg)   Body Composition  Body Fat %: 36.4 % Fat Mass (lbs): 59.4 lbs Muscle Mass (lbs): 98.8 lbs Total Body Water (lbs): 67 lbs Visceral Fat Rating : 9   Other Clinical Data Today's Visit #: 7 Starting Date: 07/02/23 Comments: Cat 2     ASSESSMENT AND PLAN: Assessment & Plan Hypothyroidism, unspecified type Last TSH was within normal limits and at goal.  No change in meds or dose at this time.  Will need repeat level in 2 months. Obesity (BMI 30-39.9), Starting BMI 30.4  BMI 27.0-27.9,adult    Diet: Lagena is currently in the action stage of change. As such, her  goal is to continue with weight loss efforts and has agreed to the Category 2 Plan.   Exercise:  Older adults should determine their level of effort for physical activity relative to their level of fitness.  Behavior Modification:  We discussed the following Behavioral Modification Strategies today: increasing lean protein intake, decreasing simple carbohydrates, increasing vegetables, meal planning and cooking strategies, and planning for success.   Return in about 8 weeks (around 03/26/2024).   She was informed of the importance of frequent follow up visits to maximize her success with intensive lifestyle modifications for her multiple health conditions.  Attestation Statements:   Reviewed by clinician on day of visit: allergies, medications, problem list, medical history, surgical history, family history, social history, and previous encounter notes.     Adelita Cho, MD

## 2024-02-10 NOTE — Assessment & Plan Note (Signed)
 Last TSH was within normal limits and at goal.  No change in meds or dose at this time.  Will need repeat level in 2 months.

## 2024-03-09 ENCOUNTER — Other Ambulatory Visit: Payer: Self-pay | Admitting: Internal Medicine

## 2024-03-17 NOTE — Patient Instructions (Incomplete)
 Next appointment: Follow up in one year for your annual wellness visit    Preventive Care 67 Years and Older, Female Preventive care refers to lifestyle choices and visits with your health care provider that can promote health and wellness. What does preventive care include? A yearly physical exam. This is also called an annual well check. Dental exams once or twice a year. Routine eye exams. Ask your health care provider how often you should have your eyes checked. Personal lifestyle choices, including: Daily care of your teeth and gums. Regular physical activity. Eating a healthy diet. Avoiding tobacco and drug use. Limiting alcohol use. Practicing safe sex. Taking low-dose aspirin every day. Taking vitamin and mineral supplements as recommended by your health care provider. What happens during an annual well check? The services and screenings done by your health care provider during your annual well check will depend on your age, overall health, lifestyle risk factors, and family history of disease. Counseling  Your health care provider may ask you questions about your: Alcohol use. Tobacco use. Drug use. Emotional well-being. Home and relationship well-being. Sexual activity. Eating habits. History of falls. Memory and ability to understand (cognition). Work and work Astronomer. Reproductive health. Screening  You may have the following tests or measurements: Height, weight, and BMI. Blood pressure. Lipid and cholesterol levels. These may be checked every 5 years, or more frequently if you are over 67 years old. Skin check. Lung cancer screening. You may have this screening every year starting at age 67 if you have a 30-pack-year history of smoking and currently smoke or have quit within the past 15 years. Fecal occult blood test (FOBT) of the stool. You may have this test every year starting at age 67. Flexible sigmoidoscopy or colonoscopy. You may have a  sigmoidoscopy every 5 years or a colonoscopy every 10 years starting at age 67. Hepatitis C blood test. Hepatitis B blood test. Sexually transmitted disease (STD) testing. Diabetes screening. This is done by checking your blood sugar (glucose) after you have not eaten for a while (fasting). You may have this done every 1-3 years. Bone density scan. This is done to screen for osteoporosis. You may have this done starting at age 67. Mammogram. This may be done every 1-2 years. Talk to your health care provider about how often you should have regular mammograms. Talk with your health care provider about your test results, treatment options, and if necessary, the need for more tests. Vaccines  Your health care provider may recommend certain vaccines, such as: Influenza vaccine. This is recommended every year. Tetanus, diphtheria, and acellular pertussis (Tdap, Td) vaccine. You may need a Td booster every 10 years. Zoster vaccine. You may need this after age 67. Pneumococcal 13-valent conjugate (PCV13) vaccine. One dose is recommended after age 67. Pneumococcal polysaccharide (PPSV23) vaccine. One dose is recommended after age 67. Talk to your health care provider about which screenings and vaccines you need and how often you need them. This information is not intended to replace advice given to you by your health care provider. Make sure you discuss any questions you have with your health care provider. Document Released: 07/29/2015 Document Revised: 03/21/2016 Document Reviewed: 05/03/2015 Elsevier Interactive Patient Education  2017 [employer].  Fall Prevention in the Home Falls can cause injuries. They can happen to people of all ages. There are many things you can do to make your home safe and to help prevent falls. What can I do on the outside of  my home? Regularly fix the edges of walkways and driveways and fix any cracks. Remove anything that might make you trip as you walk through a  door, such as a raised step or threshold. Trim any bushes or trees on the path to your home. Use bright outdoor lighting. Clear any walking paths of anything that might make someone trip, such as rocks or tools. Regularly check to see if handrails are loose or broken. Make sure that both sides of any steps have handrails. Any raised decks and porches should have guardrails on the edges. Have any leaves, snow, or ice cleared regularly. Use sand or salt on walking paths during winter. Clean up any spills in your garage right away. This includes oil or grease spills. What can I do in the bathroom? Use night lights. Install grab bars by the toilet and in the tub and shower. Do not use towel bars as grab bars. Use non-skid mats or decals in the tub or shower. If you need to sit down in the shower, use a plastic, non-slip stool. Keep the floor dry. Clean up any water that spills on the floor as soon as it happens. Remove soap buildup in the tub or shower regularly. Attach bath mats securely with double-sided non-slip rug tape. Do not have throw rugs and other things on the floor that can make you trip. What can I do in the bedroom? Use night lights. Make sure that you have a light by your bed that is easy to reach. Do not use any sheets or blankets that are too big for your bed. They should not hang down onto the floor. Have a firm chair that has side arms. You can use this for support while you get dressed. Do not have throw rugs and other things on the floor that can make you trip. What can I do in the kitchen? Clean up any spills right away. Avoid walking on wet floors. Keep items that you use a lot in easy-to-reach places. If you need to reach something above you, use a strong step stool that has a grab bar. Keep electrical cords out of the way. Do not use floor polish or wax that makes floors slippery. If you must use wax, use non-skid floor wax. Do not have throw rugs and other things  on the floor that can make you trip. What can I do with my stairs? Do not leave any items on the stairs. Make sure that there are handrails on both sides of the stairs and use them. Fix handrails that are broken or loose. Make sure that handrails are as long as the stairways. Check any carpeting to make sure that it is firmly attached to the stairs. Fix any carpet that is loose or worn. Avoid having throw rugs at the top or bottom of the stairs. If you do have throw rugs, attach them to the floor with carpet tape. Make sure that you have a light switch at the top of the stairs and the bottom of the stairs. If you do not have them, ask someone to add them for you. What else can I do to help prevent falls? Wear shoes that: Do not have high heels. Have rubber bottoms. Are comfortable and fit you well. Are closed at the toe. Do not wear sandals. If you use a stepladder: Make sure that it is fully opened. Do not climb a closed stepladder. Make sure that both sides of the stepladder are locked into place. Ask  someone to hold it for you, if possible. Clearly mark and make sure that you can see: Any grab bars or handrails. First and last steps. Where the edge of each step is. Use tools that help you move around (mobility aids) if they are needed. These include: Canes. Walkers. Scooters. Crutches. Turn on the lights when you go into a dark area. Replace any light bulbs as soon as they burn out. Set up your furniture so you have a clear path. Avoid moving your furniture around. If any of your floors are uneven, fix them. If there are any pets around you, be aware of where they are. Review your medicines with your doctor. Some medicines can make you feel dizzy. This can increase your chance of falling. Ask your doctor what other things that you can do to help prevent falls. This information is not intended to replace advice given to you by your health care provider. Make sure you discuss any  questions you have with your health care provider. Document Released: 04/28/2009 Document Revised: 12/08/2015 Document Reviewed: 08/06/2014 Elsevier Interactive Patient Education  2017 [employer].

## 2024-03-18 ENCOUNTER — Encounter: Payer: Self-pay | Admitting: Internal Medicine

## 2024-03-18 ENCOUNTER — Ambulatory Visit: Admitting: Internal Medicine

## 2024-03-18 ENCOUNTER — Ambulatory Visit

## 2024-03-18 VITALS — Ht 65.0 in | Wt 163.0 lb

## 2024-03-18 DIAGNOSIS — Z0000 Encounter for general adult medical examination without abnormal findings: Secondary | ICD-10-CM | POA: Diagnosis not present

## 2024-03-18 NOTE — Addendum Note (Signed)
 Addended by: PERRI RONAL PARAS on: 03/18/2024 12:20 PM   Modules accepted: Level of Service

## 2024-03-18 NOTE — Progress Notes (Addendum)
 Subjective:   Diana House is a 68 y.o. female who presents for Medicare Annual (Subsequent) preventive examination.  Visit Complete: Virtual I connected with  Jama Jenkins Furnace on 03/18/24 by a audio enabled telemedicine application and verified that I am speaking with the correct person using two identifiers.  Patient Location: Home  Provider Location: Office/Clinic  I discussed the limitations of evaluation and management by telemedicine. The patient expressed understanding and agreed to proceed.  Vital Signs: Because this visit was a virtual/telehealth visit, some criteria may be missing or patient reported. Any vitals not documented were not able to be obtained and vitals that have been documented are patient reported.  Patient Medicare AWV questionnaire was completed by the patient on 03/18/2024; I have confirmed that all information answered by patient is correct and no changes since this date.  Cardiac Risk Factors include: advanced age (>27men, >75 women);hypertension;dyslipidemia     Objective:    Today's Vitals   03/18/24 1127  Weight: 163 lb (73.9 kg)  Height: 5' 5 (1.651 m)   Body mass index is 27.12 kg/m.     03/18/2024   11:25 AM 10/24/2023    3:14 PM 10/23/2022    3:01 PM  Advanced Directives  Does Patient Have a Medical Advance Directive? Yes Yes Yes  Type of Advance Directive Living will;Healthcare Power of Attorney Living will Healthcare Power of [place];Living will  Does patient want to make changes to medical advance directive?  No - Patient declined   Copy of Healthcare Power of Attorney in Chart? No - copy requested      Current Medications (verified) Outpatient Encounter Medications as of 03/18/2024  Medication Sig   ALPRAZolam  (XANAX ) 0.25 MG tablet TAKE 1 TABLET BY MOUTH 3 TIMES  DAILY AS NEEDED FOR ANXIETY   buPROPion  (WELLBUTRIN  XL) 300 MG 24 hr tablet TAKE 1 TABLET BY MOUTH DAILY   Cholecalciferol (VITAMIN D ) 2000 UNITS CAPS Take 1 capsule by mouth  in the morning and at bedtime.  1 capsule in AM, and 2 capsules in PM;   escitalopram  (LEXAPRO ) 10 MG tablet TAKE 1 TABLET BY MOUTH DAILY   estradiol (VIVELLE-DOT) 0.1 MG/24HR Place 1 patch onto the skin 2 (two) times a week.   hydrochlorothiazide  (HYDRODIURIL ) 25 MG tablet Take 0.5 tablets (12.5 mg total) by mouth daily.   HYDROcodone -acetaminophen  (NORCO) 10-325 MG tablet Take 1 tablet by mouth every 6 (six) hours as needed for moderate pain or severe pain.   hydrocortisone-pramoxine (ANALPRAM-HC) 2.5-1 % rectal cream Place 1 Application rectally as needed for hemorrhoids or anal itching.   levothyroxine  (SYNTHROID ) 137 MCG tablet TAKE 1 TABLET BY MOUTH DAILY   metoprolol  succinate (TOPROL -XL) 25 MG 24 hr tablet TAKE 1 TABLET BY MOUTH ONCE  DAILY   ondansetron  (ZOFRAN ) 4 MG tablet TAKE 1 TABLET BY MOUTH EVERY 8 HOURS AS NEEDED FOR NAUSEA AND VOMITING   progesterone (PROMETRIUM) 200 MG capsule Take 200 mg by mouth.  Every 3 months   promethazine  (PHENERGAN ) 25 MG tablet Take 1 tablet (25 mg total) by mouth every 8 (eight) hours as needed for nausea or vomiting.   tiZANidine (ZANAFLEX) 2 MG tablet Take 2 mg by mouth every 8 (eight) hours as needed for muscle spasms.   triamcinolone cream (KENALOG) 0.1 % Apply 1 Application topically as needed.   zolpidem (AMBIEN) 10 MG tablet Take 10 mg by mouth at bedtime as needed.   No facility-administered encounter medications on file as of 03/18/2024.  Allergies (verified) Clarithromycin   History: Past Medical History:  Diagnosis Date   Allergy 1998   Rash   Anxiety    on meds   Arthritis 2018   Lumbar   Chronic pain    Degenerative disc disease, cervical and lumbar    Depression    on meds   Fatty liver    Fibrocystic breast disease    GERD (gastroesophageal reflux disease)    hx of   Heart murmur 1988   Hypertension    on meds   Hypothyroid    Nail fungus    Neuromuscular disorder (HCC) 2000   Cervical disc dz   Palpitations     Seasonal allergies    Skin cancer    Swallowing difficulty    Thyroid  disease    on meds   Vitamin D  deficiency    Past Surgical History:  Procedure Laterality Date   CERVICAL DISCECTOMY  02/14/1999   c5-6; c6-7   ENDOMETRIAL ABLATION  07/17/2003   EYE SURGERY     Ambliopia   LUMBAR SPINE SURGERY  08/2016   MOHS SURGERY     SPINE SURGERY  2000   cervical x 2 (2022)   STRABISMUS SURGERY     age 68, age 54   TONSILLECTOMY     Family History  Problem Relation Age of Onset   Hyperlipidemia Mother    Heart disease Mother    Obesity Mother    Obesity Father    Cancer Father    Hyperlipidemia Father    Diabetes Father    Heart disease Father    Hypertension Father    Colon polyps Father 75   Social History   Socioeconomic History   Marital status: Married    Spouse name: Not on file   Number of children: Not on file   Years of education: Not on file   Highest education level: Associate degree: academic program  Occupational History   Not on file  Tobacco Use   Smoking status: Former    Current packs/day: 0.00    Types: Cigarettes    Quit date: 07/17/1987    Years since quitting: 36.6   Smokeless tobacco: Never  Vaping Use   Vaping status: Never Used  Substance and Sexual Activity   Alcohol use: Not Currently    Alcohol/week: 7.0 standard drinks of alcohol    Types: 7 Glasses of wine per week   Drug use: Not Currently   Sexual activity: Yes    Birth control/protection: Other-see comments    Comment: Endometrial Ablation  Other Topics Concern   Not on file  Social History Narrative   RN, 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 Married. Social Alcohol Consumption   2025 - Remodeling her house, currently staying in an Air BnB in the interim.    Social Drivers of Corporate investment banker Strain: Low Risk  (03/18/2024)   Overall Financial Resource Strain (CARDIA)    Difficulty of Paying Living Expenses: Not hard at all  Food  Insecurity: No Food Insecurity (03/18/2024)   Hunger Vital Sign    Worried About Running Out of Food in the Last Year: Never true    Ran Out of Food in the Last Year: Never true  Transportation Needs: No Transportation Needs (03/18/2024)   PRAPARE - Administrator, Civil Service (Medical): No    Lack of Transportation (Non-Medical): No  Physical Activity: Insufficiently Active (03/18/2024)  Exercise Vital Sign    Days of Exercise per Week: 1 day    Minutes of Exercise per Session: 10 min  Stress: Stress Concern Present (03/18/2024)   [employer] of Occupational Health - Occupational Stress Questionnaire    Feeling of Stress: To some extent  Social Connections: Moderately Integrated (03/18/2024)   Social Connection and Isolation Panel    Frequency of Communication with Friends and Family: More than three times a week    Frequency of Social Gatherings with Friends and Family: Once a week    Attends Religious Services: More than 4 times per year    Active Member of [employer] or Organizations: No    Attends Engineer, structural: Never    Marital Status: Married    Tobacco Counseling Counseling given: No   Clinical Intake:  Pre-visit preparation completed: Yes  Pain : No/denies pain     BMI - recorded: 27.12 Nutritional Status: BMI 25 -29 Overweight Nutritional Risks: None  How often do you need to have someone help you when you read instructions, pamphlets, or other written materials from your doctor or pharmacy?: 1 - Never  Interpreter Needed?: No  Information entered by :: Diana House, CMA   Activities of Daily Living    03/18/2024   11:33 AM 03/17/2024   11:41 PM  In your present state of health, do you have any difficulty performing the following activities:  Hearing? 0 0  Vision? 0 0  Difficulty concentrating or making decisions? 0 0  Walking or climbing stairs? 0 0  Dressing or bathing? 0 0  Doing errands, shopping? 0 0  Preparing Food and  eating ? N N  Using the Toilet? N N  In the past six months, have you accidently leaked urine? N Y  Do you have problems with loss of bowel control? N N  Managing your Medications? N N  Managing your Finances? N N  Housekeeping or managing your Housekeeping? N N    Patient Care Team: Perri Ronal PARAS, MD as PCP - General (Internal Medicine)  Indicate any recent Medical Services you may have received from other than [facility] providers in the past year (date may be approximate).     Assessment:   This is a routine wellness examination for [employer].  Hearing/Vision screen No results found.   Goals Addressed   None    Depression Screen    03/18/2024   11:53 AM 10/24/2023    3:40 PM 11/06/2022    3:03 PM 10/30/2022    9:27 AM 10/23/2022    3:01 PM 04/05/2022   11:35 AM 10/05/2021    2:05 PM  PHQ 2/9 Scores  PHQ - 2 Score 0 1 0 0 0 0 0    Fall Risk    03/18/2024   11:36 AM 03/17/2024   11:41 PM 03/17/2024    5:14 PM 10/24/2023    3:11 PM 11/06/2022    3:03 PM  Fall Risk   Falls in the past year? 1 1 1 1  0  Number falls in past yr: 0 0 0 0 0  Injury with Fall? 0 0 0 0 0  Risk for fall due to : Other (Comment)   No Fall Risks;Other (Comment) No Fall Risks  Follow up Falls prevention discussed;Education provided;Falls evaluation completed   Falls evaluation completed Falls prevention discussed    MEDICARE RISK AT HOME: Medicare Risk at Home Any stairs in or around the home?: Yes If so, are there any without  handrails?: No Home free of loose throw rugs in walkways, pet beds, electrical cords, etc?: Yes Adequate lighting in your home to reduce risk of falls?: Yes Life alert?: No Use of a cane, walker or w/c?: No Grab bars in the bathroom?: Yes Shower chair or bench in shower?: Yes Elevated toilet seat or a handicapped toilet?: No  TIMED UP AND GO:  Was the test performed?  No    Cognitive Function:        03/18/2024   11:35 AM 10/24/2023    3:15 PM 10/23/2022    3:02 PM  6CIT Screen   What Year? 0 points 4 points 0 points  What month? 0 points 3 points 0 points  What time? 0 points 3 points 0 points  Count back from 20 0 points 4 points 0 points  Months in reverse 0 points 4 points 0 points  Repeat phrase 0 points 10 points 0 points  Total Score 0 points 28 points 0 points    Immunizations Immunization History  Administered Date(s) Administered   Fluad Quad(high Dose 65+) 04/16/2022, 03/22/2023   Influenza Split 04/13/2013   Influenza Whole 07/16/2008   Influenza,inj,Quad PF,6+ Mos 05/07/2018, 04/30/2019   Influenza-Unspecified 04/13/2015, 05/05/2020, 05/08/2021, 05/12/2022   PFIZER(Purple Top)SARS-COV-2 Vaccination 07/14/2019, 08/04/2019, 05/05/2020   PNEUMOCOCCAL CONJUGATE-20 05/16/2022   Pfizer(Comirnaty)Fall Seasonal Vaccine 12 years and older 04/16/2022   Tdap 11/28/2006, 02/11/2020   Unspecified SARS-COV-2 Vaccination 05/12/2022   Zoster Recombinant(Shingrix) 03/09/2021, 05/25/2021   Zoster, Live 12/18/2021, 03/25/2022    TDAP status: Up to date  Flu Vaccine status: Due, Education has been provided regarding the importance of this vaccine. Advised may receive this vaccine at local pharmacy or Health Dept. Aware to provide a copy of the vaccination record if obtained from local pharmacy or Health Dept. Verbalized acceptance and understanding.  Pneumococcal vaccine status: Up to date  Covid-19 vaccine status: Information provided on how to obtain vaccines.   Qualifies for Shingles Vaccine? Yes   Zostavax completed Yes   Shingrix Completed?: Yes  Screening Tests Health Maintenance  Topic Date Due   DEXA SCAN  Never done   INFLUENZA VACCINE  02/14/2024   MAMMOGRAM  04/17/2024   Fecal DNA (Cologuard)  09/10/2024   Medicare Annual Wellness (AWV)  10/23/2024   DTaP/Tdap/Td (3 - Td or Tdap) 02/10/2030   Pneumococcal Vaccine: 50+ Years  Completed   Hepatitis C Screening  Completed   Zoster Vaccines- Shingrix  Completed   HPV VACCINES  Aged Out    Meningococcal B Vaccine  Aged Out   Colonoscopy  Discontinued   COVID-19 Vaccine  Discontinued    Health Maintenance  Health Maintenance Due  Topic Date Due   DEXA SCAN  Never done   INFLUENZA VACCINE  02/14/2024    Colorectal cancer screening: Type of screening: Cologuard. Completed 09/10/2021. Repeat every 3 years  Mammogram status: Completed 04/17/2022. Repeat every year  Bone Density status: Completed 10/07/23. Results reflect: Bone density results: NORMAL. Repeat every 3 years.  Lung Cancer Screening: (Low Dose CT Chest recommended if Age 67-80 years, 20 pack-year currently smoking OR have quit w/in 15years.) does not qualify.   Additional Screening:  Hepatitis C Screening: does not qualify; Completed 10/23/2013  Vision Screening: Recommended annual ophthalmology exams for early detection of glaucoma and other disorders of the eye. Is the patient up to date with their annual eye exam?  Yes  Who is the provider or what is the name of the office in which the patient  attends annual eye exams? Dr. Portia, [facility] If pt is not established with a provider, would they like to be referred to a provider to establish care? No .   Dental Screening: Recommended annual dental exams for proper oral hygiene  Community Resource Referral / Chronic Care Management: CRR required this visit?  No   CCM required this visit?  No     Plan:     I have personally reviewed and noted the following in the patient's chart:   Medical and social history Use of alcohol, tobacco or illicit drugs  Current medications and supplements including opioid prescriptions. Patient is not currently taking opioid prescriptions. Functional ability and status Nutritional status Physical activity Advanced directives List of other physicians Hospitalizations, surgeries, and ER visits in previous 12 months Vitals Screenings to include cognitive, depression, and falls Referrals and  appointments  In addition, I have reviewed and discussed with patient certain preventive protocols, quality metrics, and best practice recommendations. A written personalized care plan for preventive services as well as general preventive health recommendations were provided to patient.     Diana House, CMA   03/18/2024   After Visit Summary: (Mail) Due to this being a telephonic visit, the after visit summary with patients personalized plan was offered to patient via mail   I, Ronal JINNY Hailstone, MD, have reviewed all documentation for this visit. The documentation on 03/18/2024 for the exam, diagnosis, procedures, and orders are all accurate and complete.

## 2024-03-30 ENCOUNTER — Ambulatory Visit
Admission: RE | Admit: 2024-03-30 | Discharge: 2024-03-30 | Disposition: A | Discharge status: Home / Self Care | Source: Ambulatory Visit | Attending: Obstetrics and Gynecology | Admitting: Obstetrics and Gynecology

## 2024-03-30 DIAGNOSIS — N6310 Unspecified lump in the right breast, unspecified quadrant: Secondary | ICD-10-CM

## 2024-04-07 ENCOUNTER — Ambulatory Visit (INDEPENDENT_AMBULATORY_CARE_PROVIDER_SITE_OTHER): Admitting: Family Medicine

## 2024-04-27 ENCOUNTER — Other Ambulatory Visit

## 2024-04-27 DIAGNOSIS — I10 Essential (primary) hypertension: Secondary | ICD-10-CM

## 2024-04-27 DIAGNOSIS — Z0000 Encounter for general adult medical examination without abnormal findings: Secondary | ICD-10-CM

## 2024-04-27 DIAGNOSIS — E781 Pure hyperglyceridemia: Secondary | ICD-10-CM

## 2024-04-27 DIAGNOSIS — E039 Hypothyroidism, unspecified: Secondary | ICD-10-CM

## 2024-04-28 ENCOUNTER — Other Ambulatory Visit: Payer: Self-pay

## 2024-04-28 ENCOUNTER — Encounter: Payer: Self-pay | Admitting: Internal Medicine

## 2024-04-28 ENCOUNTER — Ambulatory Visit (INDEPENDENT_AMBULATORY_CARE_PROVIDER_SITE_OTHER): Admitting: Internal Medicine

## 2024-04-28 ENCOUNTER — Ambulatory Visit: Payer: Self-pay | Admitting: Internal Medicine

## 2024-04-28 VITALS — BP 110/70 | HR 68 | Ht 65.0 in | Wt 161.0 lb

## 2024-04-28 DIAGNOSIS — E559 Vitamin D deficiency, unspecified: Secondary | ICD-10-CM | POA: Diagnosis not present

## 2024-04-28 DIAGNOSIS — K219 Gastro-esophageal reflux disease without esophagitis: Secondary | ICD-10-CM

## 2024-04-28 DIAGNOSIS — I10 Essential (primary) hypertension: Secondary | ICD-10-CM

## 2024-04-28 DIAGNOSIS — Z9889 Other specified postprocedural states: Secondary | ICD-10-CM

## 2024-04-28 DIAGNOSIS — E039 Hypothyroidism, unspecified: Secondary | ICD-10-CM

## 2024-04-28 DIAGNOSIS — Z6826 Body mass index (BMI) 26.0-26.9, adult: Secondary | ICD-10-CM | POA: Diagnosis not present

## 2024-04-28 DIAGNOSIS — Z860101 Personal history of adenomatous and serrated colon polyps: Secondary | ICD-10-CM

## 2024-04-28 DIAGNOSIS — K760 Fatty (change of) liver, not elsewhere classified: Secondary | ICD-10-CM

## 2024-04-28 DIAGNOSIS — M7918 Myalgia, other site: Secondary | ICD-10-CM

## 2024-04-28 DIAGNOSIS — R718 Other abnormality of red blood cells: Secondary | ICD-10-CM

## 2024-04-28 DIAGNOSIS — Z85820 Personal history of malignant melanoma of skin: Secondary | ICD-10-CM | POA: Diagnosis not present

## 2024-04-28 DIAGNOSIS — Z87898 Personal history of other specified conditions: Secondary | ICD-10-CM

## 2024-04-28 DIAGNOSIS — F419 Anxiety disorder, unspecified: Secondary | ICD-10-CM

## 2024-04-28 NOTE — Progress Notes (Addendum)
 Patient Care Team: Perri Ronal PARAS, MD as PCP - General (Internal Medicine)  Visit Date: 04/28/24  Subjective:    Patient ID: Diana House , Female   DOB: 12/04/1955, 68 y.o.    MRN: [id number]   68 y.o. Female presents today for 6 month follow up for Hypertension, Hypothyroidism. Patient has a past medical history of Anxiety/Depression, Musculoskeletal pain, Vitamin D  deficiency.  History of Hypertension treated with Metoprolol  succinate 25 mg daily Diana hydrochlorothiazide  12.5 mg daily. Blood pressure is normal at 110/70.   History of Anxiety/Depression; Insomnia treated with Xanax  0.25 mg up to three House daily as needed, Wellbutrin -XL 300 mg daily, Lexapro  10 mg daily, Diana Ambien 10 mg at bedtime.    History of Hypothyroidism treated with Levothyroxine  150 mg daily. 04/27/2024 TSH 0.53  History of Musculoskeletal pain treated with Norco 10-325 as needed every 6 hours.   History of Vitamin-D Deficiency treated with Vitamin-D 6000 units daily.     Taking Semaglutide for weight loss. Current weight 161lbs. Diana  BMI 26.79.   Labs 04/27/2024 RBC 3.77, MCV 103.7, MCH 34.5, CO2 33, ST 41, AST 46.    03/30/2024 Mammogram Stable -likely benign complicated cyst in the outer breast at 9 o'clock 6 cm from the nipple Diana stable likely benign clustered cysts in the UPPER OUTER QUADRANT of the 11 o'clock 4 cm from the nipple.  10/09/2023 Bone density normal  Past Medical History:  Diagnosis Date   Allergy 1998   Rash   Anxiety    on meds   Arthritis 2018   Lumbar   Chronic pain    Degenerative disc disease, cervical Diana lumbar    Depression    on meds   Fatty liver    Fibrocystic breast disease    GERD (gastroesophageal reflux disease)    hx of   Heart murmur 1988   Hypertension    on meds   Hypothyroid    Nail fungus    Neuromuscular disorder (HCC) 2000   Cervical disc dz   Palpitations    Seasonal allergies    Skin cancer    Swallowing difficulty    Thyroid  disease     on meds   Vitamin D  deficiency      Family History  Problem Relation Age of Onset   Hyperlipidemia Mother    Heart disease Mother    Obesity Mother    Obesity Father    Cancer Father    Hyperlipidemia Father    Diabetes Father    Heart disease Father    Hypertension Father    Colon polyps Father 14    Social Hx: Married.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 2 sons Diana 1 daughter.     Review of Systems  All other systems reviewed Diana are negative.       Objective:   Vitals: BP 110/70   Pulse 68   Ht 5' 5 (1.651 m)   Wt 161 lb (73 kg)   SpO2 98%   BMI 26.79 kg/m    Physical Exam Vitals Diana nursing note reviewed.  Constitutional:      General: Diana House.    Appearance: Normal appearance. Diana House.  HENT:     Head: Normocephalic Diana atraumatic.  Neck:     Thyroid : No thyroid  mass, thyromegaly or thyroid  tenderness.     Vascular: No carotid bruit.  Cardiovascular:     Rate Diana Rhythm: Normal  rate Diana regular rhythm. No extrasystoles are present.    Pulses: Normal pulses.     Heart sounds: Normal heart sounds. No murmur heard.    No friction rub. No gallop.  Pulmonary:     Effort: Pulmonary effort is normal. No respiratory House.     Breath sounds: Normal breath sounds. No wheezing or rales.  Lymphadenopathy:     Cervical: No cervical adenopathy.  Skin:    General: Skin is warm Diana dry.  Neurological:     Mental Status: Diana House, Diana House, Diana House. Mental status is at baseline.  Psychiatric:        Mood Diana Affect: Mood normal.        Behavior: Behavior normal.        Thought Content: Thought content normal.        Judgment: Judgment normal.       Results:   Studies obtained Diana personally reviewed by me:   03/30/2024 Mammogram Stable likely benign complicated cyst in the outer breast at 9 o'clock 6 cm from the nipple Diana stable likely benign clustered cysts  in the UPPER OUTER QUADRANT of the 11 o'clock 4 cm from the nipple.  10/09/2023 Bone density normal  Labs:       Component Value Date/House   NA 139 04/27/2024 0930   NA 138 07/02/2023 0938   K 4.1 04/27/2024 0930   CL 98 04/27/2024 0930   CO2 33 (H) 04/27/2024 0930   GLUCOSE 82 04/27/2024 0930   BUN 11 04/27/2024 0930   BUN 10 07/02/2023 0938   CREATININE 0.58 04/27/2024 0930   CALCIUM 9.6 04/27/2024 0930   PROT 6.8 04/27/2024 0930   PROT 6.7 07/02/2023 0938   ALBUMIN 4.3 07/02/2023 0938   AST 41 (H) 04/27/2024 0930   ALT 46 (H) 04/27/2024 0930   ALKPHOS 93 07/02/2023 0938   BILITOT 0.5 04/27/2024 0930   BILITOT 0.3 07/02/2023 0938   GFRNONAA 94 09/27/2020 1000   GFRAA 109 09/27/2020 1000     Lab Results  Component Value Date   WBC 6.1 04/27/2024   HGB 13.0 04/27/2024   HCT 39.1 04/27/2024   MCV 103.7 (H) 04/27/2024   PLT 288 04/27/2024    Lab Results  Component Value Date   CHOL 154 04/27/2024   HDL 62 04/27/2024   LDLCALC 73 04/27/2024   TRIG 107 04/27/2024   CHOLHDL 2.5 04/27/2024    Lab Results  Component Value Date   HGBA1C 5.4 04/25/2023     Lab Results  Component Value Date   TSH 0.53 04/27/2024          Assessment & Plan:   Hypertension:  stable Diana treated with Metoprolol  succinate 25 mg daily Diana hydrochlorothiazide  12.5 mg daily. Blood pressure is normal at 110/70.   Anxiety/Depression- treated with Xanax  0.25 mg up to three House daily as needed, Wellbutrin -XL 300 mg daily, Lexapro  10 mg daily    Hypothyroidism: treated with Levothyroxine  150 mg daily. 04/27/2024 TSH 0.53  Musculoskeletal pain: treated with Norco 10-325 as needed every 6 hours. Hx 3 cervical disc surgeries  Vitamin-D Deficiency: treated with Vitamin-D 6000 units daily.    Hx of melanoma- seen by Dermatologist annually   Taking Semaglutide for weight loss. Current weight 161 lb BMI 26.79. Seen at [facility]  Hx of shift worker's insomnia  longstanding  Hx of strabismus surgery x 2 1983 Diana 1988  03/30/2024 Mammogram Stable likely benign complicated cyst  in the outer breast at 9 o'clock 6 cm from the nipple Diana stable likely benign clustered cysts in the UPPER OUTER QUADRANT of the 11 o'clock 4 cm from the nipple.  10/09/2023 Bone density normal  Diana House,Makayla C Reid,acting as a scribe for Ronal JINNY Hailstone, MD.,have documented all relevant documentation on the behalf of Ronal JINNY Hailstone, MD,as directed by  Ronal JINNY Hailstone, MD while in the presence of Ronal JINNY Hailstone, MD.

## 2024-04-29 LAB — LIPID PANEL
Cholesterol: 154 mg/dL (ref <200)
HDL: 62 mg/dL (ref >=50)
LDL Cholesterol (Calc): 73 mg/dL
Non-HDL Cholesterol (Calc): 92 mg/dL (ref <130)
Total CHOL/HDL Ratio: 2.5 (calc) (ref <5.0)
Triglycerides: 107 mg/dL (ref <150)

## 2024-04-29 LAB — COMPREHENSIVE METABOLIC PANEL WITH GFR
AG Ratio: 1.6 (calc) (ref 1.0–2.5)
ALT: 46 U/L — ABNORMAL HIGH (ref 6–29)
AST: 41 U/L — ABNORMAL HIGH (ref 10–35)
Albumin: 4.2 g/dL (ref 3.6–5.1)
Alkaline phosphatase (APISO): 81 U/L (ref 37–153)
BUN: 11 mg/dL (ref 7–25)
CO2: 33 mmol/L — ABNORMAL HIGH (ref 20–32)
Calcium: 9.6 mg/dL (ref 8.6–10.4)
Chloride: 98 mmol/L (ref 98–110)
Creat: 0.58 mg/dL (ref 0.50–1.05)
Globulin: 2.6 g/dL (ref 1.9–3.7)
Glucose, Bld: 82 mg/dL (ref 65–99)
Potassium: 4.1 mmol/L (ref 3.5–5.3)
Sodium: 139 mmol/L (ref 135–146)
Total Bilirubin: 0.5 mg/dL (ref 0.2–1.2)
Total Protein: 6.8 g/dL (ref 6.1–8.1)
eGFR: 99 mL/min/1.73m2 (ref >=60)

## 2024-04-29 LAB — CBC WITH DIFFERENTIAL/PLATELET
Absolute Lymphocytes: 2403 {cells}/uL (ref 850–3900)
Absolute Monocytes: 494 {cells}/uL (ref 200–950)
Basophils Absolute: 67 {cells}/uL (ref 0–200)
Basophils Relative: 1.1 %
Eosinophils Absolute: 439 {cells}/uL (ref 15–500)
Eosinophils Relative: 7.2 %
HCT: 39.1 % (ref 35.0–45.0)
Hemoglobin: 13 g/dL (ref 11.7–15.5)
MCH: 34.5 pg — ABNORMAL HIGH (ref 27.0–33.0)
MCHC: 33.2 g/dL (ref 32.0–36.0)
MCV: 103.7 fL — ABNORMAL HIGH (ref 80.0–100.0)
MPV: 11.3 fL (ref 7.5–12.5)
Monocytes Relative: 8.1 %
Neutro Abs: 2696 {cells}/uL (ref 1500–7800)
Neutrophils Relative %: 44.2 %
Platelets: 288 Thousand/uL (ref 140–400)
RBC: 3.77 Million/uL — ABNORMAL LOW (ref 3.80–5.10)
RDW: 11.6 % (ref 11.0–15.0)
Total Lymphocyte: 39.4 %
WBC: 6.1 Thousand/uL (ref 3.8–10.8)

## 2024-04-29 LAB — TSH: TSH: 0.53 m[IU]/L (ref 0.40–4.50)

## 2024-04-29 LAB — FOLATE: Folate: 8.8 ng/mL

## 2024-04-29 LAB — VITAMIN B12: Vitamin B-12: 524 pg/mL (ref 200–1100)

## 2024-05-06 NOTE — Patient Instructions (Addendum)
 It was a pleasure to see you today. Labs are stable. Return in 6 months for Medicare wellness visit and physical examination.No change in medications. Continue weight loss management at [facility].

## 2024-05-07 ENCOUNTER — Encounter: Payer: Self-pay | Admitting: Internal Medicine

## 2024-05-13 ENCOUNTER — Ambulatory Visit: Payer: Self-pay | Admitting: Internal Medicine

## 2024-06-09 ENCOUNTER — Other Ambulatory Visit: Payer: Self-pay | Admitting: Internal Medicine

## 2024-06-24 ENCOUNTER — Other Ambulatory Visit: Payer: Self-pay | Admitting: Internal Medicine

## 2024-06-29 ENCOUNTER — Other Ambulatory Visit: Payer: Self-pay | Admitting: Internal Medicine

## 2024-07-23 ENCOUNTER — Other Ambulatory Visit: Payer: Self-pay | Admitting: Physical Medicine and Rehabilitation

## 2024-07-23 DIAGNOSIS — M549 Dorsalgia, unspecified: Secondary | ICD-10-CM

## 2024-08-05 ENCOUNTER — Ambulatory Visit
Admission: RE | Admit: 2024-08-05 | Discharge: 2024-08-05 | Disposition: A | Source: Ambulatory Visit | Attending: Physical Medicine and Rehabilitation | Admitting: Physical Medicine and Rehabilitation

## 2024-08-05 DIAGNOSIS — M549 Dorsalgia, unspecified: Secondary | ICD-10-CM

## 2024-08-13 ENCOUNTER — Other Ambulatory Visit: Payer: Self-pay | Admitting: Obstetrics and Gynecology

## 2024-08-13 DIAGNOSIS — N6310 Unspecified lump in the right breast, unspecified quadrant: Secondary | ICD-10-CM

## 2024-09-28 ENCOUNTER — Encounter

## 2024-10-26 ENCOUNTER — Other Ambulatory Visit: Payer: Self-pay

## 2024-10-30 ENCOUNTER — Ambulatory Visit: Payer: Self-pay | Admitting: Internal Medicine
